# Patient Record
Sex: Female | Born: 1975 | Race: Black or African American | Hispanic: No | Smoking: Never smoker
Health system: Southern US, Community
[De-identification: ages and names within clinical notes are randomized; demographics above are authoritative.]

## PROBLEM LIST (undated history)

## (undated) DIAGNOSIS — I10 Essential (primary) hypertension: Secondary | ICD-10-CM

## (undated) DIAGNOSIS — E559 Vitamin D deficiency, unspecified: Secondary | ICD-10-CM

## (undated) DIAGNOSIS — F419 Anxiety disorder, unspecified: Secondary | ICD-10-CM

## (undated) DIAGNOSIS — N941 Unspecified dyspareunia: Secondary | ICD-10-CM

## (undated) DIAGNOSIS — N946 Dysmenorrhea, unspecified: Secondary | ICD-10-CM

## (undated) DIAGNOSIS — IMO0002 Reserved for concepts with insufficient information to code with codable children: Secondary | ICD-10-CM

## (undated) DIAGNOSIS — E785 Hyperlipidemia, unspecified: Secondary | ICD-10-CM

## (undated) DIAGNOSIS — E7800 Pure hypercholesterolemia, unspecified: Secondary | ICD-10-CM

## (undated) HISTORY — DX: Essential (primary) hypertension: I10

## (undated) HISTORY — PX: OTHER SURGICAL HISTORY: SHX169

## (undated) HISTORY — DX: Vitamin D deficiency, unspecified: E55.9

## (undated) HISTORY — DX: Hyperlipidemia, unspecified: E78.5

## (undated) HISTORY — DX: Anxiety disorder, unspecified: F41.9

---

## 1997-09-21 ENCOUNTER — Emergency Department (HOSPITAL_COMMUNITY): Admission: EM | Admit: 1997-09-21 | Discharge: 1997-09-21 | Payer: Self-pay | Admitting: Emergency Medicine

## 1998-05-14 ENCOUNTER — Inpatient Hospital Stay (HOSPITAL_COMMUNITY): Admission: AD | Admit: 1998-05-14 | Discharge: 1998-05-14 | Payer: Self-pay | Admitting: Obstetrics and Gynecology

## 1998-06-16 ENCOUNTER — Ambulatory Visit (HOSPITAL_COMMUNITY): Admission: RE | Admit: 1998-06-16 | Discharge: 1998-06-16 | Payer: Self-pay | Admitting: Obstetrics and Gynecology

## 1998-08-17 ENCOUNTER — Inpatient Hospital Stay (HOSPITAL_COMMUNITY): Admission: AD | Admit: 1998-08-17 | Discharge: 1998-08-17 | Payer: Self-pay | Admitting: Obstetrics and Gynecology

## 1998-08-30 ENCOUNTER — Inpatient Hospital Stay (HOSPITAL_COMMUNITY): Admission: AD | Admit: 1998-08-30 | Discharge: 1998-09-02 | Payer: Self-pay | Admitting: Obstetrics and Gynecology

## 1998-09-02 ENCOUNTER — Encounter (HOSPITAL_COMMUNITY): Admission: RE | Admit: 1998-09-02 | Discharge: 1998-12-01 | Payer: Self-pay | Admitting: Obstetrics and Gynecology

## 1999-01-08 ENCOUNTER — Emergency Department (HOSPITAL_COMMUNITY): Admission: EM | Admit: 1999-01-08 | Discharge: 1999-01-08 | Payer: Self-pay | Admitting: *Deleted

## 1999-02-06 ENCOUNTER — Emergency Department (HOSPITAL_COMMUNITY): Admission: EM | Admit: 1999-02-06 | Discharge: 1999-02-06 | Payer: Self-pay | Admitting: *Deleted

## 1999-10-30 ENCOUNTER — Encounter: Payer: Self-pay | Admitting: Emergency Medicine

## 1999-10-30 ENCOUNTER — Emergency Department (HOSPITAL_COMMUNITY): Admission: EM | Admit: 1999-10-30 | Discharge: 1999-10-31 | Payer: Self-pay | Admitting: Emergency Medicine

## 1999-12-01 ENCOUNTER — Other Ambulatory Visit: Admission: RE | Admit: 1999-12-01 | Discharge: 1999-12-01 | Payer: Self-pay | Admitting: Obstetrics and Gynecology

## 2000-01-25 ENCOUNTER — Ambulatory Visit (HOSPITAL_COMMUNITY): Admission: RE | Admit: 2000-01-25 | Discharge: 2000-01-25 | Payer: Self-pay | Admitting: Obstetrics and Gynecology

## 2000-01-25 ENCOUNTER — Encounter: Payer: Self-pay | Admitting: Obstetrics and Gynecology

## 2000-02-23 ENCOUNTER — Ambulatory Visit (HOSPITAL_COMMUNITY): Admission: RE | Admit: 2000-02-23 | Discharge: 2000-02-23 | Payer: Self-pay | Admitting: Obstetrics and Gynecology

## 2000-02-23 ENCOUNTER — Encounter: Payer: Self-pay | Admitting: Obstetrics and Gynecology

## 2000-06-10 ENCOUNTER — Inpatient Hospital Stay (HOSPITAL_COMMUNITY): Admission: AD | Admit: 2000-06-10 | Discharge: 2000-06-12 | Payer: Self-pay | Admitting: Obstetrics and Gynecology

## 2001-02-13 ENCOUNTER — Other Ambulatory Visit: Admission: RE | Admit: 2001-02-13 | Discharge: 2001-02-13 | Payer: Self-pay | Admitting: Obstetrics and Gynecology

## 2001-05-28 HISTORY — PX: COLONOSCOPY: SHX174

## 2001-07-28 ENCOUNTER — Ambulatory Visit (HOSPITAL_COMMUNITY): Admission: RE | Admit: 2001-07-28 | Discharge: 2001-07-28 | Payer: Self-pay | Admitting: Obstetrics and Gynecology

## 2001-09-12 ENCOUNTER — Emergency Department (HOSPITAL_COMMUNITY): Admission: EM | Admit: 2001-09-12 | Discharge: 2001-09-12 | Payer: Self-pay | Admitting: Emergency Medicine

## 2002-02-04 ENCOUNTER — Encounter: Payer: Self-pay | Admitting: Obstetrics and Gynecology

## 2002-02-04 ENCOUNTER — Ambulatory Visit (HOSPITAL_COMMUNITY): Admission: RE | Admit: 2002-02-04 | Discharge: 2002-02-04 | Payer: Self-pay | Admitting: Obstetrics and Gynecology

## 2002-03-13 ENCOUNTER — Emergency Department (HOSPITAL_COMMUNITY): Admission: EM | Admit: 2002-03-13 | Discharge: 2002-03-14 | Payer: Self-pay | Admitting: Emergency Medicine

## 2002-12-18 ENCOUNTER — Ambulatory Visit (HOSPITAL_COMMUNITY): Admission: RE | Admit: 2002-12-18 | Discharge: 2002-12-18 | Payer: Self-pay | Admitting: Gastroenterology

## 2003-06-29 HISTORY — PX: UMBILICAL HERNIA REPAIR: SHX196

## 2003-06-29 HISTORY — PX: DIAGNOSTIC LAPAROSCOPY: SUR761

## 2003-07-08 ENCOUNTER — Ambulatory Visit (HOSPITAL_COMMUNITY): Admission: RE | Admit: 2003-07-08 | Discharge: 2003-07-08 | Payer: Self-pay | Admitting: Obstetrics and Gynecology

## 2003-08-13 ENCOUNTER — Inpatient Hospital Stay (HOSPITAL_COMMUNITY): Admission: AD | Admit: 2003-08-13 | Discharge: 2003-08-13 | Payer: Self-pay | Admitting: Obstetrics & Gynecology

## 2003-09-08 ENCOUNTER — Ambulatory Visit (HOSPITAL_COMMUNITY): Admission: RE | Admit: 2003-09-08 | Discharge: 2003-09-08 | Payer: Self-pay | Admitting: Obstetrics and Gynecology

## 2006-03-29 ENCOUNTER — Ambulatory Visit: Payer: Self-pay | Admitting: Gastroenterology

## 2006-09-26 HISTORY — PX: TUBAL LIGATION: SHX77

## 2006-12-23 ENCOUNTER — Inpatient Hospital Stay (HOSPITAL_COMMUNITY): Admission: AD | Admit: 2006-12-23 | Discharge: 2006-12-23 | Payer: Self-pay | Admitting: Obstetrics and Gynecology

## 2006-12-30 ENCOUNTER — Inpatient Hospital Stay (HOSPITAL_COMMUNITY): Admission: AD | Admit: 2006-12-30 | Discharge: 2007-01-01 | Payer: Self-pay | Admitting: Oncology

## 2006-12-31 ENCOUNTER — Encounter (INDEPENDENT_AMBULATORY_CARE_PROVIDER_SITE_OTHER): Payer: Self-pay | Admitting: Obstetrics and Gynecology

## 2007-12-27 HISTORY — PX: LAPAROSCOPIC INCISIONAL / UMBILICAL / VENTRAL HERNIA REPAIR: SUR789

## 2008-01-26 ENCOUNTER — Encounter (INDEPENDENT_AMBULATORY_CARE_PROVIDER_SITE_OTHER): Payer: Self-pay | Admitting: General Surgery

## 2008-01-26 ENCOUNTER — Ambulatory Visit (HOSPITAL_COMMUNITY): Admission: RE | Admit: 2008-01-26 | Discharge: 2008-01-27 | Payer: Self-pay | Admitting: General Surgery

## 2009-01-27 ENCOUNTER — Emergency Department (HOSPITAL_COMMUNITY): Admission: EM | Admit: 2009-01-27 | Discharge: 2009-01-27 | Payer: Self-pay | Admitting: Emergency Medicine

## 2009-06-01 ENCOUNTER — Encounter: Admission: RE | Admit: 2009-06-01 | Discharge: 2009-06-01 | Payer: Self-pay | Admitting: Emergency Medicine

## 2010-10-10 NOTE — Op Note (Signed)
NAME:  Christie [name], Christie [name]              ACCOUNT NO.:  [id number]   MEDICAL RECORD NO.:  [id number]          PATIENT TYPE:  AMB   LOCATION:  DAY                          FACILITY:  [facility]   PHYSICIAN:  Anselm Pancoast. Weatherly, M.D.DATE OF BIRTH:  1975-07-03   DATE OF PROCEDURE:  01/26/2008  DATE OF DISCHARGE:                               OPERATIVE REPORT   PREOPERATIVE DIAGNOSIS:  Recurrent umbilical supraumbilical hernia.   OPERATION:  Repair of recurrent umbilical ventral hernia with Proceed  mesh.  General anesthesia.   SURGEON:  Dr. Consuello Bossier.   ASSISTANT:  Nurse.   HISTORY:  Mr.  Christie [name] is a 35 year old female who originally in  combination with a GYN procedure performed by Dr. Ambrose Mantle the patient had  a small fascial defect at the umbilicus that I closed through the  Pfannenstiel incision for endometriosis when she was 27.  I saw her back  in April 1990 at which time she was having a small bulge above her  umbilicus, and she had had a recent pregnancy, and her abdomen was kind  of stretched.  I recommended that this new hernia be repaired.  It will  obviously need mesh, and she presented back in July wondering if she  could have an abdominoplasty simultaneously with Dr. Ellery [name], she had seen  him.  I recommended that:  I would concentrate on the umbilical hernia;  whether or not the abdominoplasty and etc., could be approved could be  left to she and Dr. Ellery [name].  She returns and says she just wants to  proceed on with the umbilical hernia repair, and she is here for the  planned procedure.  The bulge is above the umbilicus slightly to the  right of midline, and I will plan on making a small incision on the  area.  I am sure that we will have to place mesh and whether or not it  will be basically in the abdominal wall or intraperitoneally will be  determined.  The patient preoperatively was given a gram of Ancef,  positioned on the OR table.  Induction of general anesthesia  endotracheal tube.  We had got complete relaxation, and I made a small  vertical incision down above the umbilicus.  Her original small incision  that I had was kind of a of subumbilical transverse incision.  She has  had an umbilical ring in that had been previously removed, and there is  no evidence of any inflammation of the abdominal wall that I could see  at this time.  Sharp dissection down through the skin and subcutaneous  tissue.  The little bulge was identified and, I opened very carefully  into the peritoneal cavity.  There was a lot of omentum up in the area.  This kind of goes to the right of midline and really with the old defect  and some hard nodules kind of in the umbilical intraperitoneally.  It  was necessary to completely free all this up circumferentially.  Good  hemostasis was determined by actually ligating into the little areas of  the omentum that was caught there, and  then I actually just removed all  of the old scar at the umbilicus where the previous suture repair had  been done.  The hernia sac was removed.  The omentum that was within it  was viable, placed back in the abdominal cavity, and her abdominal wall  was so thin that it is not going to possible to use a piece of mesh  extraperitoneally intra-abdominally, and I elected to use a piece of  Proceed mesh and actually trimmed it, so it was about 4 x 5 inches and  comes 4 x 6 inches.  I placed it in the intra-abdominal cavity.  The  omentum was under it.  The bowel was well covered by the omentum, and  then I used sutures of 0 Prolene placed through the abdominal wall  through this skin incision, anchored the mesh out in all 4 sides and  then 2 sutures between each of those, so that the mesh is anchored up in  the abdominal wall without tension.  I then closed the fascia over it  with interrupted sutures of 0 Prolene, kind of picking up a little bit  of the mesh in the area for a kind of a composite repair.   The  subcutaneous tissue was then closed with 2-0 Vicryl.  I did put Marcaine  with adrenalin in the area, and then the skin  was closed with some 4-0 Vicryl sutures and then 5-0 nylon on the skin.  I used some Marcaine with abdominal binder on her, and I expect that she  will be sore enough that she will need to spend the night.  I think she  can be released tomorrow.  Hopefully, she will be able to tolerate a  liquid diet today and not need a Foley catheter.           ______________________________  Anselm Pancoast. Zachery Dakins, M.D.     WJW/MEDQ  D:  01/26/2008  T:  01/26/2008  Job:  [id number]   cc:   Christie [name], M.D.  Fax: [phone]   Anselm Pancoast. Zachery Dakins, M.D.  [address]  [place]  [place] [address]

## 2010-10-10 NOTE — Discharge Summary (Signed)
NAME:  Christie [name], Christie [name]              ACCOUNT NO.:  [id number]   MEDICAL RECORD NO.:  [id number]          PATIENT TYPE:  INP   LOCATION:  9116                          FACILITY:  [facility]   PHYSICIAN:  Sherron Monday, MD        DATE OF BIRTH:  24-Nov-1975   DATE OF ADMISSION:  12/30/2006  DATE OF DISCHARGE:  01/01/2007                               DISCHARGE SUMMARY   ADMITTING DIAGNOSIS:  Intrauterine pregnancy at term, undesired  fertility.   DISCHARGE DIAGNOSES:  Intrauterine pregnancy at term, undesired  fertility, status post spontaneous vaginal delivery and postpartum  bilateral tubal ligation.   HISTORY OF PRESENT ILLNESS:  Patient is a 35 year old G7, P2-1-3-3, at  37+4 by eleven-week ultrasound with an EDC of January 14, 2007, who  presents with regular contractions.  Evaluation in our office had a  vaginal exam of 4, 50 and -2.   PRENATAL CARE:  She had headaches that were evaluated by neurology, a  rash at approximately 23 weeks, otherwise was uncomplicated.   PAST MEDICAL HISTORY:  Not significant.   PAST SURGICAL HISTORY:  Significant for a laparoscopic umbilical hernia  repair.   PAST OB/GYN HISTORY:  She has a history of two term deliveries, 6 pounds  9 ounces; 5 pounds 10 ounces; a 36-week delivery, 4 pounds 7 ounces; two  elective abortions and one spontaneous abortion, as well as a history of  an abnormal Pap smear.   MEDICATIONS:  None.   ALLERGIES:  No known drug allergies.   SOCIAL HISTORY:  She denies alcohol, tobacco or drug use.   FAMILY HISTORY:  Noncontributory.   PRENATAL LABS:  AB-positive, antibody screen negative, RPR nonreactive,  rubella immune, hepatitis B surface antigen negative, HIV negative,  Gonorrhea negative, Chlamydia negative.  First-trimester screen within  normal limits.  Group B strep negative.   On admission, she was afebrile with stable vital signs.  Fetal heart  tones were reactive with irregular contractions.  She was, at this  time,  5, 50 and -2.  Membranes were ruptured for clear fluids.  Benign exam  otherwise.  She was monitored in her labor.  She progressed rapidly and  precipitously delivered a female infant with a weight of 2740 g and Apgars  of 8 at one minute, 9 at five minutes.   Her postpartum course was relatively uncomplicated.  She remained  afebrile with stable vital signs throughout.  Her hemoglobin decreased  from 9.8 to 9.4  She had on postpartum day #1, a tubal ligation  performed after discussion of undesired fertility with the patient and  permanence of the tubal ligation, as well as the risks, benefits, and  alternatives, which were discussed with her, as noted in her chart.  She  underwent that without complication.  She was discharged to home on  postpartum day #2 with routine discharge instructions and she had no  complaints, except forsoreness at her incision.  This was well-  controlled with medications.   She was discharged home with Motrin, Percocet and prenatal vitamins.  She will follow up in approximately two weeks for  a postpartum check of  this.   DISCHARGE INFORMATION:  She will breast-feed.  AB-positive.  Rubella  immune.  Bilateral tubal ligation.  Hemoglobin decreased from 9.8 to  9.4.      Sherron Monday, MD  Electronically Signed     JB/MEDQ  D:  01/01/2007  T:  01/01/2007  Job:  [id number]

## 2010-10-10 NOTE — Op Note (Signed)
NAME:  Christie [name], Christie [name]              ACCOUNT NO.:  [id number]   MEDICAL RECORD NO.:  [id number]          PATIENT TYPE:  INP   LOCATION:  9116                          FACILITY:  [facility]   PHYSICIAN:  Sherron Monday, MD        DATE OF BIRTH:  [birth year]   DATE OF PROCEDURE:  12/31/2006  DATE OF DISCHARGE:                               OPERATIVE REPORT   PREOPERATIVE DIAGNOSIS:  Undesired fertility, postpartum, status post  spontaneous vaginal delivery.   POSTOPERATIVE DIAGNOSIS:  Undesired fertility, postpartum, status post  spontaneous vaginal delivery.   PROCEDURE:  Postpartum bilateral tubal ligation by Pomeroy method.   SURGEON:  Sherron Monday, MD   ANESTHESIA:  General endotracheal and local.   IV FLUIDS:  700 mL.   URINE OUTPUT:  In and out cath at start of procedure.   ESTIMATED BLOOD LOSS:  Minimal.   COMPLICATIONS:  None.   PATHOLOGY:  Bilateral tubal segments   FINDINGS:  Normal postpartum uterus, normal tubes and ovaries  bilaterally.   DISPOSITION:  Stable to PACU.   PROCEDURE:  After informed consent was reviewed with the patient  including risks, benefits and alternatives of surgical procedure  including but not limited to bleeding, infection, damage to surrounding  organs as well as a rate of failure of approximately one out 200 with  increased risk of ectopic pregnancy. She was transported to the  operating room where a approximately 2 cm infraumbilical incision was  made in the level of her previous hernia repair incision carried through  the underlying layer of fascia.  As we were extending the incision down,  it was noted that she had mesh superiorly so the decision was made to  proceed inferior to the mesh.  The fascia was grasped with Kocher  clamps, elevated and incised with Mayo scissors.  The incision was  extended laterally with Mayo scissors.  The peritoneum was entered  bluntly. The uterus was immediately palpated.  The left tube was easily  visualixed  after bowels were packed away with a moist tape laparotomy  sponge.  The tube was visualized followed out to the fimbriated end and  ligated in Pomeroy fashion, doubly ligated with 0-0 plain gut.  The  intervening portion was excise and sent to pathology  Hemostasis was  assured after the tube was excised and this tube was replaced to the  abdomen and attention was turned to the right tube which in a similar  fashion, the bowel was packed away with moist tape laparotomy sponge and  the tube was swept and easily visualized followed out to the fimbriated  end and in a Pomeroy fashion was doubly ligated with 0-0 plain gut.  The  intervening portion was excised and sent to pathology.  Again when the  tube was excised it was found to be hemostatic.  The tube was returned  to the abdomen,  the sponge was removed.  The fascia was reapproximated 0-0 Vicryl in a  running fashion. The skin was closed in a subcuticular fashion with 3-0  Vicryl.  The patient tolerated the procedure well.  Sponge, lap and  needle counts were correct x2 at the end of procedure.      Sherron Monday, MD  Electronically Signed     JB/MEDQ  D:  12/31/2006  T:  01/01/2007  Job:  [id number]

## 2010-10-13 NOTE — Assessment & Plan Note (Signed)
[facility]                           GASTROENTEROLOGY OFFICE NOTE   NAME:[name], Christie MCCLATCHY                      MRN:          [id number]  DATE:03/29/2006                            DOB:          Oct 14, 1975    REFERRING PHYSICIAN:  Malachi Pro. Ambrose Mantle, M.D.   REASON FOR CONSULTATION:  Nausea and abdominal pain.   HISTORY OF PRESENT ILLNESS:  Christie [name] is a 35 year old African-American  female referred through the courtesy of Dr. Ambrose Mantle for evaluation. She has  had intermittent nausea. This has been a problem for years. This will occur  immediately postprandially. It is often associated with lower abdominal  pain. She complaints of sharp periumbilical pain. She underwent repair of an  umbilical hernia several years ago. The pain is unaffected by bowel  movements. She moves her bowels regularly. She develops bloating and gas and  sometimes diarrhea with dairy products. She is on no medication including  nonsteroidals. The pain is not related to her periods.   PAST MEDICAL HISTORY:  Pertinent for herniorrhaphy.   FAMILY HISTORY:  Noncontributory.   She is on no medications, she has no allergies. She neither smokes nor  drinks. She is separated and works for [employer].   REVIEW OF SYSTEMS:  Positive for headaches.   PHYSICAL EXAMINATION:  VITAL SIGNS:  Pulse 70, blood pressure 120/64, weight  141.  HEENT:  EOMI. PERRLA. Sclerae are anicteric.  Conjunctivae are pink.  NECK:  Supple without thyromegaly, adenopathy or carotid bruits.  CHEST:  Clear to auscultation and percussion without adventitious sounds.  CARDIAC:  Regular rhythm; normal S1 S2.  There are no murmurs, gallops or  rubs.  ABDOMEN:  She has a small periumbilical hernia that is minimally tender.  There are no abdominal masses or organomegaly.  EXTREMITIES:  Full range of motion.  No cyanosis, clubbing or edema.  RECTAL:  Deferred.   IMPRESSION:  1. Postprandial nausea. This is probably due  to nonulcer dyspepsia. Active      peptic disease is less likely.  2. Abdominal pain. This may be related to #1. In addition, she appears to      have a small ventral hernia that could be symptomatic.  3. Lactose intolerant.   RECOMMENDATION:  1. Trial of ranitidine 150 mg twice a day.  2. Consider reevaluation with surgery.  3. To consider upper endoscopy if symptoms are not improved with empiric      therapy with ranitidine.  4. Lactate supplementation.     Christie [name]. Christie Dice, MD,FACG  Electronically Signed    RDK/MedQ  DD: 03/29/2006  DT: 03/29/2006  Job #: [id number]   cc:   Malachi Pro. Ambrose Mantle, M.D.  Anselm Pancoast. Zachery Dakins, M.D.

## 2010-10-13 NOTE — Op Note (Signed)
NAME:  Christie [name], Christie [name]                         ACCOUNT NO.:  [id number]   MEDICAL RECORD NO.:  [id number]                   PATIENT TYPE:  AMB   LOCATION:  DAY                                  FACILITY:  [facility]   PHYSICIAN:  Malachi Pro. Ambrose Mantle, M.D.              DATE OF BIRTH:  02/16/1976   DATE OF PROCEDURE:  07/08/2003  DATE OF DISCHARGE:                                 OPERATIVE REPORT   PREOPERATIVE DIAGNOSES:  1. Chronic left lower abdominal pain.  2. Dyspareunia.  3. Menorrhagia.  4. Umbilical hernia.   POSTOPERATIVE DIAGNOSES:  1. Chronic left lower abdominal pain.  2. Dyspareunia.  3. Menorrhagia.  4. Umbilical hernia.   OPERATION:  1. Diagnostic laparoscopy.  2. Repair of umbilical hernia.   OPERATORS:  Malachi Pro. Ambrose Mantle, M.D., and Anselm Pancoast. Zachery Dakins, M.D.   General anesthesia.   The patient was brought to the operating room and placed under satisfactory  general anesthesia.  She was placed in the [place] stirrups.  The abdomen,  vulva, vagina, and urethra were prepped with Betadine solution.  The bladder  was emptied with a [place] catheter.  The cervix was drawn into the operative  field and after examination, a Hulka cannula was placed into the uterus and  attached to the anterior cervical lip.  On examination the uterus was  anterior and normal size.  There was a question of a 2 cm mass just lateral  to the left uterosacral ligament.  I could not be sure if this was an  enlarged ovary.  I did not feel any abnormalities of the cul-de-sac.  The  abdomen was then draped as a sterile field.  The umbilical hernia was  identified, and Dr. Zachery Dakins was there.  I made an incision in the inferior  aspect of the umbilicus and he entered the umbilical hernia sac.  I placed a  pursestring suture of 0 Vicryl around the fascia, placed a Hasson cannula  into the abdominal cavity, and inserted a smaller trocar suprapubically.  Exploration of the pelvis revealed the following  findings:  The posterior  cul-de-sac had several dots, one was a white bleb, two were dark brown, and  one was red, that probably represented endometriosis.  The uterus itself was  anterior and mottled in appearance, but no fibroids were seen.  It was felt  to be consistent with adenomyosis.  The anterior cul-de-sac was normal.  The  right tube was normal to its fimbriae.  The right ovary was normal.  The  left tube was normal to its fimbriae, and the left ovary was normal.  Just  lateral to the left uterosacral ligament was a band of white tissue that was  presumably scar tissue, possibly from endometriosis.  More lateral and just  on the pelvic wall and just inferior to the left utero-ovarian ligament was  an area definitely of scarring that was thought to be  probably secondary to  endometriosis.  I did not find anything that I thought represented the 2 cm  mass that I had felt on pelvic exam, so I think with this patient's chronic  complaints that if she has not already had a CT scan, we will do a CT scan  in the postoperative period.  At the present time it appears that she  probably has adenomyosis and some pelvic endometriosis with scarring,  although there is absolutely no adherence of the ovaries to the posterior  broad ligament.  After I completed my diagnostic procedure, I looked up at  the liver, it was smooth.  I did not see the gallbladder.  I did not see any  upper abdominal abnormalities.  Dr. Zachery Dakins and I both looked at the  sigmoid, and no abnormalities were found.  He then proceeded to dissect out  the umbilical hernia and he will dictate that separately, and he closed the  skin.  The patient was returned to recovery in satisfactory condition.                                               Malachi Pro. Ambrose Mantle, M.D.    TFH/MEDQ  D:  07/08/2003  T:  07/08/2003  Job:  [id number]   cc:   Anselm Pancoast. Zachery Dakins, M.D.  [address], [address]  [place]  [place] [address]  Fax:  [phone]

## 2010-10-13 NOTE — Op Note (Signed)
NAME:  [name], [name]                         ACCOUNT NO.:  [id number]   MEDICAL RECORD NO.:  [id number]                   PATIENT TYPE:  AMB   LOCATION:  DAY                                  FACILITY:  [facility]   PHYSICIAN:  Anselm Pancoast. Zachery Dakins, M.D.          DATE OF BIRTH:  06/30/1975   DATE OF PROCEDURE:  07/08/2003  DATE OF DISCHARGE:                                 OPERATIVE REPORT   PREOPERATIVE DIAGNOSES:  1. Endometriosis.  2. Umbilical hernia.   POSTOPERATIVE DIAGNOSES:  1. Endometriosis.  2. Umbilical hernia.   OPERATION:  Repair of umbilical hernia.  Dr. Ambrose Mantle did a laparoscopic exam  for the endometriosis and he dictated that.   HISTORY:  Christie [name] is a 35 year old female who has had chronic left  sided pelvic pain.  She has seen Dr. Ambrose Mantle after having a GI work up and he  is planning to do a laparoscopic examination and possible other procedures.  He noted that she had an umbilical hernia and asked me to see her and I  agreed to scrub in and met the patient preoperatively.  Dr. Ambrose Mantle opened  the incision and went through the umbilical hernia sac.  A purse-string  suture was placed and then he did the laparoscopic exam, confirming that she  did have some pelvic endometriosis.  After he completed his procedure and a  5 mm trocar had been placed, I then removed the little preperitoneal space  and hernia sac, and then actually did a transverse closure with 0 surgical  and five sutures all total being used.  It was pants-over-vest and did not  actually close the peritoneum, but made sure there was no bleed and then  where the little fatty tissue had been removed, those pedicles had been  ligated with 3-0 Vicryl.  The subcutaneous tissue was closed with 3-0 Vicryl  sutures.  They inverted nicely and also put a simple stitch subcuticularly  in the 5 mm trocar site.  The skin was closed transversely with Steri-  Strips.  I did inject Marcaine for immediate  postoperative pain.  She will  be released after a short stay in the recovery room.  I will see her back in  the office in approximately two weeks for a final postop visit.                                               Anselm Pancoast. Zachery Dakins, M.D.    WJW/MEDQ  D:  07/08/2003  T:  07/08/2003  Job:  [id number]   cc:   Malachi Pro. Ambrose Mantle, M.D.  [address]  [address]  [place]  [place] [address]  Fax: [phone]

## 2010-10-13 NOTE — Discharge Summary (Signed)
[facility]  Patient:    Christie [name], Christie [name]                      MRN: [id number] Adm. Date:  [id number] Disc. Date: 06/12/00 Attending:  Malon Kindle                           Discharge Summary  HISTORY OF PRESENT ILLNESS:   This is a 35 year old black married female, para 1-1-0-2, gravida 3, last period unknown, [facility] January 29 and June 24, 2000, by first- and second-trimester ultrasounds respectively, admitted in early later.  Blood group and type AB positive, negative antibody, sickle cell negative.  RPR nonreactive.  Rubella immune.  Hepatitis B surface antigen negative.  HIV negative.  GC and chlamydia negative.  One-hour Glucola 112. Group B strep positive with her last pregnancy.  Triple screen normal. Vaginal ultrasound on November 16, 1999:  Crown-rump length 1.7 cm, 8 weeks 2 days, [facility] June 25, 2000.  Repeat ultrasound on January 24, 2001:  Average gestational age [redacted] weeks 3 days, [facility] June 24, 2000.  The patient had uncomplicated prenatal course.  She began contracting the night prior to admission.  She was seen in my office on the day of admission with cervix 4 cm, 70%, vertex at a -2.  She came here and progressed to 4-5 cm.  ALLERGIES:                    No known allergies.  PAST MEDICAL HISTORY:         Sinus surgery at age 71.  Usual childhood diseases.  ALCOHOL, TOBACCO, DRUGS:      None.  FAMILY HISTORY:               No significant history in first-degree relatives.  OBSTETRICAL HISTORY:          In November 1995, she delivered a 6-pound 9-ounce female at 40 weeks.  In April 2000, a 4-pound 7-ounce female at 36 weeks.  PHYSICAL EXAMINATION:  VITAL SIGNS:                  Normal.  ABDOMEN:                      Soft.  Fundal height 37 cm.  Fetal heart tones normal.  PELVIC:                       Cervix 4-5 cm, 70%, vertex, -2.  Artificial rupture of membranes produced clear fluid.  ADMISSION IMPRESSION:          Intrauterine pregnancy at 38 weeks, early labor. History of positive group B strep.  HOSPITAL COURSE:              The patient was placed on IV penicillin.  Her contractions remained irregular, so Pitocin was begun at 9:22 p.m.  At 9:27 p.m. the cervix was 7+ cm, 80-90%, vertex at a -1.  She progressed very rapidly and delivered spontaneously over an intact perineum by Dr. Ambrose Mantle before we could place her in stirrups or prep the perineum.  The infant was female, 5 pounds 10 ounces, Apgars of 9 at one and 9 at five minutes, placenta intact.  Uterus normal, no lacerations were encountered.  Blood loss about 200 cc.  Postpartum the patient did very well and was discharged on the  second postpartum day.  RPR was nonreactive.  Hemoglobin on admission 11.3, hematocrit 32.5, white count 7300, platelet count 201,000.  Follow-up hemoglobin 10.8, hematocrit 31.6.  FINAL DIAGNOSIS:              Intrauterine pregnancy at 38 weeks, delivered occiput anterior.  OPERATIONS:                   Spontaneous delivery OA.  CONDITION ON DISCHARGE:       Improved.  DISCHARGE INSTRUCTIONS:       Include our regular discharge instruction booklet.  MEDICATIONS:                  The patient declines analgesics at discharge.  FOLLOW-UP:                    She is advised to return in six weeks for follow-up examination. DD:  06/12/00 TD:  06/12/00 Job: [id number]

## 2011-03-12 LAB — CBC
HCT: 30.4 — ABNORMAL LOW
MCHC: 32.2
MCV: 72.8 — ABNORMAL LOW
RBC: 3.96
RDW: 19 — ABNORMAL HIGH
WBC: 9.8

## 2011-09-14 ENCOUNTER — Encounter (HOSPITAL_COMMUNITY): Payer: Self-pay

## 2011-09-25 ENCOUNTER — Encounter (HOSPITAL_COMMUNITY)
Admission: RE | Admit: 2011-09-25 | Discharge: 2011-09-25 | Disposition: A | Discharge status: Home / Self Care | Payer: BC Managed Care – PPO | Source: Ambulatory Visit | Attending: Obstetrics and Gynecology | Admitting: Obstetrics and Gynecology

## 2011-09-25 ENCOUNTER — Encounter (HOSPITAL_COMMUNITY): Payer: Self-pay | Admitting: *Deleted

## 2011-09-25 ENCOUNTER — Encounter (HOSPITAL_COMMUNITY): Payer: Self-pay

## 2011-09-25 HISTORY — DX: Pure hypercholesterolemia, unspecified: E78.00

## 2011-09-25 HISTORY — DX: Reserved for concepts with insufficient information to code with codable children: IMO0002

## 2011-09-25 LAB — CBC
Hemoglobin: 11.5 g/dL — ABNORMAL LOW (ref 12.0–15.0)
MCH: 25.4 pg — ABNORMAL LOW (ref 26.0–34.0)
Platelets: 278 10*3/uL (ref 150–400)
WBC: 6.5 10*3/uL (ref 4.0–10.5)

## 2011-09-25 LAB — SURGICAL PCR SCREEN: MRSA, PCR: NEGATIVE

## 2011-09-25 NOTE — Patient Instructions (Addendum)
   Your procedure is scheduled on:  Tuesday, May 7th  Enter through the Main Entrance of [facility] at: 7:30am Pick up the phone at the desk and dial [phone] and inform us of your arrival.  Please call this number if you have any problems the morning of surgery: [phone]  Remember: Do not eat food after midnight: Monday Do not drink clear liquids after: Monday Take these medicines the morning of surgery with a SIP OF WATER: None  Do not wear jewelry, make-up, or FINGER nail polish Do not wear lotions, powders, perfumes or deodorant. Do not shave 48 hours prior to surgery. Do not bring valuables to the hospital. Contacts, dentures or bridgework may not be worn into surgery.  Leave suitcase in the car. After Surgery it may be brought to your room. For patients being admitted to the hospital, checkout time is 11:00am the day of discharge.  Home with fiance Christie [name] Asa cell [phone].  Patients discharged on the day of surgery will not be allowed to drive home.     Remember to use your hibiclens as instructed.Please shower with 1/2 bottle the evening before your surgery and the other 1/2 bottle the morning of surgery. Neck down avoiding private area.

## 2011-10-01 MED ORDER — CEFAZOLIN SODIUM-DEXTROSE 2-3 GM-% IV SOLR
2.0000 g | INTRAVENOUS | Status: AC
  Administered 2011-10-02: 2 g via INTRAVENOUS
  Filled 2011-10-01: qty 50

## 2011-10-02 ENCOUNTER — Encounter (HOSPITAL_COMMUNITY)
Admission: RE | Disposition: A | Discharge status: Home / Self Care | Payer: Self-pay | Source: Ambulatory Visit | Attending: Obstetrics and Gynecology

## 2011-10-02 ENCOUNTER — Encounter (HOSPITAL_COMMUNITY): Payer: Self-pay | Admitting: Anesthesiology

## 2011-10-02 ENCOUNTER — Ambulatory Visit (HOSPITAL_COMMUNITY): Payer: BC Managed Care – PPO | Admitting: Anesthesiology

## 2011-10-02 ENCOUNTER — Ambulatory Visit (HOSPITAL_COMMUNITY)
Admission: RE | Admit: 2011-10-02 | Discharge: 2011-10-03 | Disposition: A | Discharge status: Home / Self Care | Payer: BC Managed Care – PPO | Source: Ambulatory Visit | Attending: Obstetrics and Gynecology | Admitting: Obstetrics and Gynecology

## 2011-10-02 ENCOUNTER — Encounter (HOSPITAL_COMMUNITY): Payer: Self-pay | Admitting: Obstetrics and Gynecology

## 2011-10-02 ENCOUNTER — Encounter (HOSPITAL_COMMUNITY): Payer: Self-pay | Admitting: *Deleted

## 2011-10-02 DIAGNOSIS — IMO0002 Reserved for concepts with insufficient information to code with codable children: Secondary | ICD-10-CM | POA: Insufficient documentation

## 2011-10-02 DIAGNOSIS — N941 Unspecified dyspareunia: Secondary | ICD-10-CM

## 2011-10-02 DIAGNOSIS — N946 Dysmenorrhea, unspecified: Secondary | ICD-10-CM

## 2011-10-02 DIAGNOSIS — Z90710 Acquired absence of both cervix and uterus: Secondary | ICD-10-CM

## 2011-10-02 HISTORY — DX: Dysmenorrhea, unspecified: N94.6

## 2011-10-02 HISTORY — DX: Unspecified dyspareunia: N94.10

## 2011-10-02 HISTORY — PX: VAGINAL HYSTERECTOMY: SHX2639

## 2011-10-02 HISTORY — PX: LAPAROSCOPY: SHX197

## 2011-10-02 LAB — BASIC METABOLIC PANEL
BUN: 10 mg/dL (ref 6–23)
Chloride: 104 mEq/L (ref 96–112)
Creatinine, Ser: 0.94 mg/dL (ref 0.50–1.10)
Glucose, Bld: 90 mg/dL (ref 70–99)
Potassium: 3.9 mEq/L (ref 3.5–5.1)

## 2011-10-02 LAB — PREGNANCY, URINE: Preg Test, Ur: NEGATIVE

## 2011-10-02 SURGERY — HYSTERECTOMY, VAGINAL
Anesthesia: General | Site: Vagina | Wound class: Clean Contaminated

## 2011-10-02 MED ORDER — SODIUM CHLORIDE 0.9 % IJ SOLN: 9.0000 mL | INTRAMUSCULAR | Status: DC | PRN

## 2011-10-02 MED ORDER — DEXAMETHASONE SODIUM PHOSPHATE 10 MG/ML IJ SOLN
INTRAMUSCULAR | Status: DC | PRN
  Administered 2011-10-02: 10 mg via INTRAVENOUS

## 2011-10-02 MED ORDER — ONDANSETRON HCL 4 MG/2ML IJ SOLN
INTRAMUSCULAR | Status: DC | PRN
  Administered 2011-10-02: 4 mg via INTRAVENOUS

## 2011-10-02 MED ORDER — GLYCOPYRROLATE 0.2 MG/ML IJ SOLN
INTRAMUSCULAR | Status: AC
  Filled 2011-10-02: qty 1

## 2011-10-02 MED ORDER — NALOXONE HCL 0.4 MG/ML IJ SOLN: 0.4000 mg | INTRAMUSCULAR | Status: DC | PRN

## 2011-10-02 MED ORDER — GLYCOPYRROLATE 0.2 MG/ML IJ SOLN
INTRAMUSCULAR | Status: DC | PRN
  Administered 2011-10-02: 0.3 mg via INTRAVENOUS
  Administered 2011-10-02: 0.4 mg via INTRAVENOUS
  Administered 2011-10-02: 0.1 mg via INTRAVENOUS

## 2011-10-02 MED ORDER — BUPIVACAINE HCL (PF) 0.25 % IJ SOLN
INTRAMUSCULAR | Status: AC
  Filled 2011-10-02: qty 30

## 2011-10-02 MED ORDER — KETOROLAC TROMETHAMINE 30 MG/ML IJ SOLN
INTRAMUSCULAR | Status: DC | PRN
  Administered 2011-10-02: 30 mg via INTRAVENOUS

## 2011-10-02 MED ORDER — MENTHOL 3 MG MT LOZG
1.0000 | LOZENGE | OROMUCOSAL | Status: DC | PRN
  Filled 2011-10-02: qty 9

## 2011-10-02 MED ORDER — FENTANYL CITRATE 0.05 MG/ML IJ SOLN
INTRAMUSCULAR | Status: AC
  Filled 2011-10-02: qty 5

## 2011-10-02 MED ORDER — ZOLPIDEM TARTRATE 5 MG PO TABS: 5.0000 mg | ORAL_TABLET | Freq: Every evening | ORAL | Status: DC | PRN

## 2011-10-02 MED ORDER — DEXAMETHASONE SODIUM PHOSPHATE 10 MG/ML IJ SOLN
INTRAMUSCULAR | Status: AC
  Filled 2011-10-02: qty 1

## 2011-10-02 MED ORDER — GUAIFENESIN 100 MG/5ML PO SOLN
15.0000 mL | ORAL | Status: DC | PRN
  Filled 2011-10-02: qty 15

## 2011-10-02 MED ORDER — BUPIVACAINE HCL (PF) 0.25 % IJ SOLN
INTRAMUSCULAR | Status: DC | PRN
  Administered 2011-10-02: 4 mL

## 2011-10-02 MED ORDER — DIPHENHYDRAMINE HCL 50 MG/ML IJ SOLN: 12.5000 mg | Freq: Four times a day (QID) | INTRAMUSCULAR | Status: DC | PRN

## 2011-10-02 MED ORDER — PROPOFOL 10 MG/ML IV EMUL
INTRAVENOUS | Status: AC
  Filled 2011-10-02: qty 20

## 2011-10-02 MED ORDER — NEOSTIGMINE METHYLSULFATE 1 MG/ML IJ SOLN
INTRAMUSCULAR | Status: DC | PRN
  Administered 2011-10-02 (×2): 2 mg via INTRAVENOUS

## 2011-10-02 MED ORDER — DIPHENHYDRAMINE HCL 12.5 MG/5ML PO ELIX
12.5000 mg | ORAL_SOLUTION | Freq: Four times a day (QID) | ORAL | Status: DC | PRN
  Filled 2011-10-02: qty 5

## 2011-10-02 MED ORDER — LACTATED RINGERS IV SOLN
INTRAVENOUS | Status: DC
  Administered 2011-10-02 – 2011-10-03 (×3): via INTRAVENOUS

## 2011-10-02 MED ORDER — LIDOCAINE HCL (CARDIAC) 20 MG/ML IV SOLN
INTRAVENOUS | Status: DC | PRN
  Administered 2011-10-02: 50 mg via INTRAVENOUS

## 2011-10-02 MED ORDER — FENOFIBRATE 160 MG PO TABS
160.0000 mg | ORAL_TABLET | Freq: Every day | ORAL | Status: DC
  Administered 2011-10-03: 160 mg via ORAL
  Filled 2011-10-02 (×3): qty 1

## 2011-10-02 MED ORDER — VASOPRESSIN 20 UNIT/ML IJ SOLN
INTRAMUSCULAR | Status: AC
  Filled 2011-10-02: qty 1

## 2011-10-02 MED ORDER — LACTATED RINGERS IV SOLN
INTRAVENOUS | Status: DC
  Administered 2011-10-02 (×3): via INTRAVENOUS

## 2011-10-02 MED ORDER — PROPOFOL 10 MG/ML IV EMUL
INTRAVENOUS | Status: DC | PRN
  Administered 2011-10-02: 180 mg via INTRAVENOUS

## 2011-10-02 MED ORDER — NEOSTIGMINE METHYLSULFATE 1 MG/ML IJ SOLN
INTRAMUSCULAR | Status: AC
Start: 2011-10-02 — End: 2011-10-02
  Filled 2011-10-02: qty 10

## 2011-10-02 MED ORDER — PHENYLEPHRINE 40 MCG/ML (10ML) SYRINGE FOR IV PUSH (FOR BLOOD PRESSURE SUPPORT)
PREFILLED_SYRINGE | INTRAVENOUS | Status: AC
Start: 2011-10-02 — End: 2011-10-02
  Filled 2011-10-02: qty 5

## 2011-10-02 MED ORDER — KETOROLAC TROMETHAMINE 30 MG/ML IJ SOLN
INTRAMUSCULAR | Status: AC
  Filled 2011-10-02: qty 1

## 2011-10-02 MED ORDER — MORPHINE SULFATE (PF) 1 MG/ML IV SOLN
INTRAVENOUS | Status: DC
  Administered 2011-10-02: 24 mg via INTRAVENOUS
  Administered 2011-10-02: 17 mL via INTRAVENOUS
  Administered 2011-10-02 (×2): via INTRAVENOUS
  Administered 2011-10-03 (×2): 5 mg via INTRAVENOUS
  Filled 2011-10-02 (×2): qty 25

## 2011-10-02 MED ORDER — ONDANSETRON HCL 4 MG PO TABS: 4.0000 mg | ORAL_TABLET | Freq: Four times a day (QID) | ORAL | Status: DC | PRN

## 2011-10-02 MED ORDER — IBUPROFEN 800 MG PO TABS: 800.0000 mg | ORAL_TABLET | Freq: Three times a day (TID) | ORAL | Status: DC | PRN

## 2011-10-02 MED ORDER — ALUM & MAG HYDROXIDE-SIMETH 200-200-20 MG/5ML PO SUSP
30.0000 mL | ORAL | Status: DC | PRN
  Filled 2011-10-02: qty 30

## 2011-10-02 MED ORDER — ONDANSETRON HCL 4 MG/2ML IJ SOLN
4.0000 mg | Freq: Four times a day (QID) | INTRAMUSCULAR | Status: DC | PRN
  Administered 2011-10-02 (×2): 4 mg via INTRAVENOUS
  Filled 2011-10-02 (×2): qty 2

## 2011-10-02 MED ORDER — PHENYLEPHRINE HCL 10 MG/ML IJ SOLN
INTRAMUSCULAR | Status: DC | PRN
  Administered 2011-10-02: 80 ug via INTRAVENOUS
  Administered 2011-10-02 (×2): 40 ug via INTRAVENOUS

## 2011-10-02 MED ORDER — HYDROMORPHONE HCL PF 1 MG/ML IJ SOLN
0.2500 mg | INTRAMUSCULAR | Status: DC | PRN
  Administered 2011-10-02 (×3): 0.25 mg via INTRAVENOUS

## 2011-10-02 MED ORDER — SIMETHICONE 80 MG PO CHEW: 80.0000 mg | CHEWABLE_TABLET | Freq: Four times a day (QID) | ORAL | Status: DC | PRN

## 2011-10-02 MED ORDER — OXYCODONE-ACETAMINOPHEN 5-325 MG PO TABS
1.0000 | ORAL_TABLET | ORAL | Status: DC | PRN
  Administered 2011-10-03: 2 via ORAL
  Filled 2011-10-02: qty 2

## 2011-10-02 MED ORDER — ONDANSETRON HCL 4 MG/2ML IJ SOLN
INTRAMUSCULAR | Status: AC
  Filled 2011-10-02: qty 2

## 2011-10-02 MED ORDER — MIDAZOLAM HCL 5 MG/5ML IJ SOLN
INTRAMUSCULAR | Status: DC | PRN
  Administered 2011-10-02: 2 mg via INTRAVENOUS

## 2011-10-02 MED ORDER — ROCURONIUM BROMIDE 100 MG/10ML IV SOLN
INTRAVENOUS | Status: DC | PRN
  Administered 2011-10-02: 10 mg via INTRAVENOUS
  Administered 2011-10-02: 20 mg via INTRAVENOUS
  Administered 2011-10-02 (×2): 10 mg via INTRAVENOUS

## 2011-10-02 MED ORDER — MIDAZOLAM HCL 2 MG/2ML IJ SOLN
INTRAMUSCULAR | Status: AC
  Filled 2011-10-02: qty 2

## 2011-10-02 MED ORDER — LIDOCAINE HCL (CARDIAC) 20 MG/ML IV SOLN
INTRAVENOUS | Status: AC
  Filled 2011-10-02: qty 5

## 2011-10-02 MED ORDER — INDIGOTINDISULFONATE SODIUM 8 MG/ML IJ SOLN
INTRAMUSCULAR | Status: AC
  Filled 2011-10-02: qty 5

## 2011-10-02 MED ORDER — HYDROMORPHONE HCL PF 1 MG/ML IJ SOLN
INTRAMUSCULAR | Status: AC
  Administered 2011-10-02: 0.25 mg via INTRAVENOUS
  Filled 2011-10-02: qty 1

## 2011-10-02 MED ORDER — FENTANYL CITRATE 0.05 MG/ML IJ SOLN
INTRAMUSCULAR | Status: DC | PRN
Start: 2011-10-02 — End: 2011-10-02
  Administered 2011-10-02 (×2): 25 ug via INTRAVENOUS
  Administered 2011-10-02: 100 ug via INTRAVENOUS
  Administered 2011-10-02: 25 ug via INTRAVENOUS
  Administered 2011-10-02: 100 ug via INTRAVENOUS
  Administered 2011-10-02: 50 ug via INTRAVENOUS
  Administered 2011-10-02: 100 ug via INTRAVENOUS
  Administered 2011-10-02: 25 ug via INTRAVENOUS

## 2011-10-02 MED ORDER — ONDANSETRON HCL 4 MG/2ML IJ SOLN: 4.0000 mg | Freq: Four times a day (QID) | INTRAMUSCULAR | Status: DC | PRN

## 2011-10-02 SURGICAL SUPPLY — 31 items
CABLE HIGH FREQUENCY MONO STRZ (ELECTRODE) IMPLANT
CHLORAPREP W/TINT 26ML (MISCELLANEOUS) ×5 IMPLANT
CLOTH BEACON ORANGE TIMEOUT ST (SAFETY) ×5 IMPLANT
CONT PATH 16OZ SNAP LID 3702 (MISCELLANEOUS) ×5 IMPLANT
COVER TABLE BACK 60X90 (DRAPES) ×5 IMPLANT
ELECT REM PT RETURN 9FT ADLT (ELECTROSURGICAL) ×5
ELECTRODE REM PT RTRN 9FT ADLT (ELECTROSURGICAL) ×4 IMPLANT
EVACUATOR PREFILTER SMOKE (MISCELLANEOUS) ×5 IMPLANT
GLOVE BIO SURGEON STRL SZ 6.5 (GLOVE) ×10 IMPLANT
GLOVE BIO SURGEON STRL SZ7 (GLOVE) ×5 IMPLANT
GLOVE ORTHO TXT STRL SZ7.5 (GLOVE) ×5 IMPLANT
GOWN PREVENTION PLUS LG XLONG (DISPOSABLE) ×20 IMPLANT
NEEDLE INSUFFLATION 14GA 120MM (NEEDLE) ×5 IMPLANT
NS IRRIG 1000ML POUR BTL (IV SOLUTION) ×5 IMPLANT
PACK LAVH (CUSTOM PROCEDURE TRAY) ×5 IMPLANT
PROTECTOR NERVE ULNAR (MISCELLANEOUS) ×5 IMPLANT
SCALPEL HARMONIC ACE (MISCELLANEOUS) ×5 IMPLANT
SET CYSTO W/LG BORE CLAMP LF (SET/KITS/TRAYS/PACK) IMPLANT
SET IRRIG TUBING LAPAROSCOPIC (IRRIGATION / IRRIGATOR) IMPLANT
SUT VIC AB 1 CT1 18XBRD ANBCTR (SUTURE) ×8 IMPLANT
SUT VIC AB 1 CT1 8-18 (SUTURE) ×2
SUT VIC AB 2-0 CT1 (SUTURE) ×5 IMPLANT
SUT VIC AB 3-0 PS2 18 (SUTURE) ×5 IMPLANT
SUT VICRYL 0 TIES 12 18 (SUTURE) ×5 IMPLANT
SUT VICRYL 0 UR6 27IN ABS (SUTURE) IMPLANT
SUT VICRYL 4-0 PS2 18IN ABS (SUTURE) ×5 IMPLANT
TOWEL OR 17X24 6PK STRL BLUE (TOWEL DISPOSABLE) ×10 IMPLANT
TRAY FOLEY CATH 14FR (SET/KITS/TRAYS/PACK) ×5 IMPLANT
TROCAR XCEL NON-BLD 5MMX100MML (ENDOMECHANICALS) ×15 IMPLANT
WARMER LAPAROSCOPE (MISCELLANEOUS) ×5 IMPLANT
WATER STERILE IRR 1000ML POUR (IV SOLUTION) ×5 IMPLANT

## 2011-10-02 NOTE — Addendum Note (Signed)
Addendum  created 10/02/11 1612 by Algis Greenhouse, CRNA   Modules edited:Notes Section

## 2011-10-02 NOTE — Anesthesia Preprocedure Evaluation (Signed)

## 2011-10-02 NOTE — Progress Notes (Signed)
Day of Surgery Procedure(s) (LRB): LAPAROSCOPY DIAGNOSTIC () HYSTERECTOMY VAGINAL (N/A)  Subjective: Patient reports tolerating PO clears, pain controlled, no nausea  Objective: I have reviewed patient's vital signs and intake and output.  General: alert GI: soft, non-tender; bowel sounds normal; no masses,  no organomegaly  Assessment: s/p Procedure(s) (LRB): LAPAROSCOPY DIAGNOSTIC () HYSTERECTOMY VAGINAL (N/A): stable  Plan: Pt doing well post-op Will advance to po meds in AM  LOS: 0 days    Raylene Carmickle W 10/02/2011, 6:02 PM

## 2011-10-02 NOTE — Anesthesia Procedure Notes (Signed)
Procedure Name: Intubation Date/Time: 10/02/2011 9:20 AM Performed by: Cephus Shelling A Pre-anesthesia Checklist: Patient identified, Patient being monitored, Emergency Drugs available and Suction available Patient Re-evaluated:Patient Re-evaluated prior to inductionOxygen Delivery Method: Circle system utilized Preoxygenation: Pre-oxygenation with 100% oxygen Intubation Type: IV induction and Inhalational induction Ventilation: Mask ventilation without difficulty Laryngoscope Size: Mac and 3 Grade View: Grade I Tube type: Oral Tube size: 7.0 mm Number of attempts: 1 Airway Equipment and Method: Stylet Placement Confirmation: ETT inserted through vocal cords under direct vision,  positive ETCO2 and breath sounds checked- equal and bilateral Secured at: 20 cm Tube secured with: Tape Dental Injury: Teeth and Oropharynx as per pre-operative assessment

## 2011-10-02 NOTE — Transfer of Care (Signed)
Immediate Anesthesia Transfer of Care Note  Patient: Christie [name]  Procedure(s) Performed: Procedure(s) (LRB): LAPAROSCOPY DIAGNOSTIC () HYSTERECTOMY VAGINAL (N/A)  Patient Location: PACU  Anesthesia Type: General  Level of Consciousness: sedated  Airway & Oxygen Therapy: Patient Spontanous Breathing and Patient connected to nasal cannula oxygen  Post-op Assessment: Report given to PACU RN  Post vital signs: Reviewed and stable  Complications: No apparent anesthesia complications

## 2011-10-02 NOTE — Anesthesia Postprocedure Evaluation (Signed)
  Anesthesia Post-op Note  Patient: Christie [name]  Procedure(s) Performed: Procedure(s) (LRB): LAPAROSCOPY DIAGNOSTIC () HYSTERECTOMY VAGINAL (N/A)  Patient Location: Women[facility] Unit  Anesthesia Type: General  Level of Consciousness: sedated  Airway and Oxygen Therapy: Patient Spontanous Breathing and Patient connected to nasal cannula oxygen  Post-op Pain: mild  Post-op Assessment: Post-op Vital signs reviewed  Post-op Vital Signs: Reviewed and stable  Complications: No apparent anesthesia complications

## 2011-10-02 NOTE — H&P (Addendum)
Christie [name] is an 36 y.o. female [phone] with dysmenorrhea/dyspareunia for definitive management LAVH +/- BSO, possible fulguration of endometriosis.    Pertinent Gynecological History: Menses: dysmenorrhea Bleeding: irregular VB, q 2 month Contraception: tubal ligation DES exposure: denies Blood transfusions: none Sexually transmitted diseases: no past history Previous GYN Procedures: LEEP  Last pap: normal Date: 4/13, HR HPV neg OB History: G6, P3124   Menstrual History: Menarche age: 61 No LMP recorded.    Past Medical History  Diagnosis Date  . Elevated cholesterol   . Abnormal Pap smear     history  . Dysmenorrhea 10/02/2011  . Dyspareunia, female 10/02/2011    Past Surgical History  Procedure Date  . Svd     x 4  . Tubal ligation 09/2006  . Diagnostic laparoscopy 06/2003    tx of endometriosis  . Hernia repair 06/2003    umbilical hernia repair  . Hernia repair 12/2007    recurrent umbilical ventral hernia w/mesh  . Colonoscopy 2003  TAB x 2 LEEP   Social History:  reports that she has never smoked. She has never used smokeless tobacco. She reports that she drinks alcohol. She reports that she uses illicit drugs.  Allergies: No Known Allergies  No prescriptions prior to admission  FH DM 2, HTN  Review of Systems  Constitutional: Negative.   HENT: Negative.   Eyes: Negative.   Respiratory: Negative.   Cardiovascular: Negative.   Gastrointestinal: Negative.   Genitourinary: Negative.   Musculoskeletal: Negative.   Skin: Negative.   Neurological: Negative.   Psychiatric/Behavioral: Negative.     There were no vitals taken for this visit. Physical Exam  Constitutional: She is oriented to person, place, and time. She appears well-developed and well-nourished.  HENT:  Head: Normocephalic and atraumatic.  Eyes: Conjunctivae are normal. Pupils are equal, round, and reactive to light.  Neck: Normal range of motion. Neck supple. No thyromegaly present.    Cardiovascular: Normal rate and regular rhythm.   Respiratory: Effort normal and breath sounds normal. No respiratory distress.  GI: Soft. Bowel sounds are normal. She exhibits no distension. There is no tenderness.  Musculoskeletal: Normal range of motion.  Neurological: She is alert and oriented to person, place, and time.  Skin: Skin is warm and dry.  Psychiatric: She has a normal mood and affect. Her behavior is normal.    No results found for this or any previous visit (from the past 24 hour(s)).  No results found.  Assessment/Plan: 96EX [id number] with dysmenorrhea and dyspaurenia for definitive management LAVH +/- BSO possible fulguration of endometriosis.  D/W pt r/b/a of surgery, pt voices understanding  D/w pt mesh at umbilical hernia repair, h/o endometriosis, try for LUQ entry  Christie [name] 10/02/2011, 12:16 AM  H&P reviewed, no changes.  Procedure reviewed, wants to proceed

## 2011-10-02 NOTE — Interval H&P Note (Signed)
History and Physical Interval Note:  10/02/2011 9:11 AM  Christie [name]  has presented today for surgery, with the diagnosis of dysmenorrhea, dyspareunia  The various methods of treatment have been discussed with the patient and family. After consideration of risks, benefits and other options for treatment, the patient has consented to  Procedure(s) (LRB): LAPAROSCOPIC ASSISTED VAGINAL HYSTERECTOMY (N/A) LAPAROSCOPIC BILATERAL SALPINGO OOPHERECTOMY (Bilateral) as a surgical intervention .  The patients' history has been reviewed, patient examined, no change in status, stable for surgery.  I have reviewed the patients' chart and labs.  Questions were answered to the patient's satisfaction.     [name],Ebony Yorio

## 2011-10-02 NOTE — Anesthesia Postprocedure Evaluation (Signed)
Anesthesia Post Note  Patient: Christie [name]  Procedure(s) Performed: Procedure(s) (LRB): LAPAROSCOPY DIAGNOSTIC () HYSTERECTOMY VAGINAL (N/A)  Anesthesia type: General  Patient location: PACU  Post pain: Pain level controlled  Post assessment: Post-op Vital signs reviewed  Last Vitals:  Filed Vitals:   10/02/11 1215  BP: 116/74  Pulse: 78  Temp:   Resp: 18    Post vital signs: Reviewed  Level of consciousness: sedated  Complications: No apparent anesthesia complications

## 2011-10-02 NOTE — Brief Op Note (Signed)
10/02/2011  11:07 AM  PATIENT:  Christie [name]  36 y.o. female  PRE-OPERATIVE DIAGNOSIS:  dysmenorrhea, dyspareunia  POST-OPERATIVE DIAGNOSIS:  dysmenorrhea, dyspareunia  PROCEDURE:  Procedure(s) (LRB): LAPAROSCOPY DIAGNOSTIC () HYSTERECTOMY VAGINAL (N/A)  SURGEON:  Surgeon(s) and Role:    * Sherron Monday, MD - Primary    * Lavina Hamman, MD - Assisting   ANESTHESIA:   general  EBL:  Total I/O In: 2000 [I.V.:2000] Out: 450 [Urine:250; Blood:200]  DRAINS: Urinary Catheter (Foley)   LOCAL MEDICATIONS USED:  MARCAINE     SPECIMEN:  Source of Specimen:  uterus and cervix  DISPOSITION OF SPECIMEN:  PATHOLOGY  COUNTS:  YES  FINDINGS: 6-8 wk size uterus, nl tubes and ovaries, omental umbilical mesh adhesions, no endometriosis noted  DICTATION: .Other Dictation: Dictation Number [id number]  PLAN OF CARE: Admit for overnight observation  PATIENT DISPOSITION:  PACU - hemodynamically stable.   Delay start of Pharmacological VTE agent (>24hrs) due to surgical blood loss or risk of bleeding: not applicable

## 2011-10-03 ENCOUNTER — Encounter (HOSPITAL_COMMUNITY): Payer: Self-pay | Admitting: Obstetrics and Gynecology

## 2011-10-03 LAB — CBC
HCT: 32.7 % — ABNORMAL LOW (ref 36.0–46.0)
MCHC: 32.1 g/dL (ref 30.0–36.0)
MCV: 79.8 fL (ref 78.0–100.0)
RDW: 15.4 % (ref 11.5–15.5)

## 2011-10-03 LAB — BASIC METABOLIC PANEL
BUN: 8 mg/dL (ref 6–23)
CO2: 26 mEq/L (ref 19–32)
Chloride: 105 mEq/L (ref 96–112)
Creatinine, Ser: 0.9 mg/dL (ref 0.50–1.10)

## 2011-10-03 MED ORDER — IBUPROFEN 800 MG PO TABS: 800.0000 mg | ORAL_TABLET | Freq: Three times a day (TID) | ORAL | Status: AC | PRN

## 2011-10-03 MED ORDER — OXYCODONE-ACETAMINOPHEN 5-325 MG PO TABS: 1.0000 | ORAL_TABLET | ORAL | Status: AC | PRN

## 2011-10-03 NOTE — Discharge Summary (Signed)
Physician Discharge Summary  Patient ID: Christie [name] MRN: [id number] DOB/AGE: [birth year] 36 y.o.  Admit date: 10/02/2011 Discharge date: 10/03/2011  Admission Diagnoses: dysmenorrhea, dyspaurenia  Discharge Diagnoses:  Principal Problem:  *Dyspareunia, female Active Problems:  Dysmenorrhea s/p vaginal hysterectomy  Discharged Condition: stable  Hospital Course: admitted for surgery, underwent TVH, dx laparoscopy, d/c home post op day 1  Consults: None  Significant Diagnostic Studies: labs: CBC, BMP  Treatments: surgery: TVH, dx laparoscopy  Discharge Exam: Blood pressure 126/71, pulse 73, temperature 98 F (36.7 C), temperature source Oral, resp. rate 18, height 5\' 3"  (1.6 m), weight 77.111 kg (170 lb), last menstrual period 09/03/2011, SpO2 100.00%. General appearance: alert and no distress Resp: clear to auscultation bilaterally Cardio: regular rate and rhythm GI: soft, non-tender; bowel sounds normal; no masses,  no organomegaly Extremities: extremities normal, atraumatic, no cyanosis or edema Incision/Wound:C/D/I  Disposition: home  Discharge Orders    Future Orders Please Complete By Expires   Diet - low sodium heart healthy      Increase activity slowly      Discharge instructions      Comments:   Call [phone] with questions or problems   May walk up steps      May shower / Bathe      Driving Restrictions      Comments:   While taking strong pain medicine   Lifting restrictions      Comments:   No greater than 25-35 lbs   Sexual Activity Restrictions      Comments:   Pelvic rest - no douching, tampons or sex for 6 weeks   Call MD for:  persistant nausea and vomiting      Call MD for:  severe uncontrolled pain        Medication List  As of 10/03/2011  9:35 AM   TAKE these medications         fenofibrate 160 MG tablet   Take 160 mg by mouth daily.      ibuprofen 200 MG tablet   Commonly known as: ADVIL,MOTRIN   Take 400 mg by mouth daily as  needed. For headache      ibuprofen 800 MG tablet   Commonly known as: ADVIL,MOTRIN   Take 1 tablet (800 mg total) by mouth every 8 (eight) hours as needed (pain).      oxyCODONE-acetaminophen 5-325 MG per tablet   Commonly known as: PERCOCET   Take 1-2 tablets by mouth every 4 (four) hours as needed (moderate to severe pain (when tolerating fluids)).           Follow-up Information    Follow up with [name],Raenell Mensing, MD. Schedule an appointment as soon as possible for a visit in 2 weeks.   Contact information:   [address] [facility] [address] [place] [address] [phone]          Signed: Sherron Monday 10/03/2011, 9:35 AM

## 2011-10-03 NOTE — Op Note (Signed)
[name], [name]              ACCOUNT NO.:  [id number]  MEDICAL RECORD NO.:  [id number]  LOCATION:  9306                          FACILITY:  [facility]  PHYSICIAN:  Sherron Monday, MD        DATE OF BIRTH:  [birth year]  DATE OF PROCEDURE:  10/02/2011 DATE OF DISCHARGE:                              OPERATIVE REPORT   PREOPERATIVE DIAGNOSIS:  Dysmenorrhea, dyspareunia.  POSTOPERATIVE DIAGNOSIS:  Dysmenorrhea, dyspareunia.  PROCEDURE:  Diagnostic laparoscopy, total vaginal hysterectomy.  SURGEON:  Sherron Monday, MD  ASSISTANT:  Zenaida Niece, MD  ANESTHESIA:  General.  EBL:  200 mL.  IV FLUIDS:  2000 mL.  URINE OUTPUT:  250 mL.  SPECIMEN:  Uterus and cervix to pathology.  COMPLICATIONS:  None.  FINDINGS:  A 6-8 weeks size uterus, normal tubes and ovaries, omental umbilical mesh adhesions and no endometriosis noted.  DISPOSITION:  Stable to the PACU.  PROCEDURE:  After informed consent was reviewed with the patient including the risks, benefits, and alternatives of the surgical procedure, she was transported to the OR, placed on the table in supine position.  Her arms were tucked and feet were placed in Yellofin stirrups.  General anesthesia was then induced, found to be adequate. She was then prepped and draped in the normal sterile fashion.  Her bladder was sterilely drained.  Using an open-sided speculum, her cervix was easily identified and a Hulka tenaculum was placed.  Attention was turned to the abdominal portion of the case and approximately 5-mm incision was made at palmer's point, approximately 3 cm subcostal at the midclavicular line.  Using a Veress needle, pneumoperitoneum was obtained after the hanging drop test had been performed.  After pneumoperitoneum was obtained, a 5-mm trocar was placed with direct visualization.  A pelvic survey was performed revealing multiple omental to anterior abdominal wall where umbilical hernia mesh was placed. Adhesions  obscuring the view of the pelvis, however, able to look  around these adhesions and normal uterus, tubes, and ovaries were noted. Cul-de-sacs were inspected.  No endometriosis was visualized.  Normal ovaries and tubes bilaterally were noted.  The camera was removed. Attention was turned to perform the vaginal portion of the case.  Using a short-weighted speculum and a Sims retractor after the tenaculum was removed, her cervix was grasped with Christie [name] tenaculum, straight and curved, and the cervix was circumscribed with Bovie cautery.  Attempt was made to enter anteriorly and attention was then turned to entering posteriorly, the posterior cul-de-sac was easily entered.  A stay suture was placed at approximately 6 o'clock and a banana speculum was placed. The uterosacral ligaments were plicated bilaterally.  Then in a stepwise fashion.  The cardinal ligaments and uterine vessels were ligated up to the level of the cornu.  The uterus was grasped with a towel clamp and flipped.  At the level of the cornu, the tubes and ovaries were excised first with suture and doubly ligated with a stick tie.  This was performed bilaterally and was noted to be hemostatic. The pedicles were inspected and found to be hemostatic.  Bleeding on the posterior cuff was made hemostatic with Bovie cautery and a suture of 0  Vicryl.  An uterosacral ligament plication was performed.  The held sutures were then tied together.  The cuff was noted to be hemostatic and using 2-0 Vicryl, the vaginal cuff was closed in a running locked fashion.  Foley catheter was sterilely placed.  Attention was returned to the abdomen.  The area was inspected, found to be hemostatic, no obvious bleeding.  The patient tolerated the procedure well.  Sponge, lap, and needle counts were correct x2.     Sherron Monday, MD     JB/MEDQ  D:  10/02/2011  T:  10/03/2011  Job:  [id number]

## 2011-10-03 NOTE — Progress Notes (Signed)
1 Day Post-Op Procedure(s) (LRB): LAPAROSCOPY DIAGNOSTIC () HYSTERECTOMY VAGINAL (N/A)  Subjective: Patient reports incisional pain, tolerating PO and no problems voiding.  Pain controlled  Objective: I have reviewed patient's vital signs, intake and output and labs.  General: alert and no distress Resp: clear to auscultation bilaterally Cardio: regularly irregular rhythm GI: soft, non-tender; bowel sounds normal; no masses,  no organomegaly and incision: clean, dry and intact Extremities: extremities normal, atraumatic, no cyanosis or edema  Assessment: s/p Procedure(s) (LRB): LAPAROSCOPY DIAGNOSTIC () HYSTERECTOMY VAGINAL (N/A): stable  Plan: Advance diet Encourage ambulation Discontinue IV fluids Discharge home d/c foley  LOS: 1 day    [name],Monee Dembeck 10/03/2011, 9:26 AM

## 2011-10-03 NOTE — Progress Notes (Signed)
UR chart review completed.  

## 2011-10-03 NOTE — Progress Notes (Signed)
Pt is discharged in the care of friend , per ambulatory. Stable, Spirits are good. Denies any excessive vaginal bleeding., or pain. Voiding and ambulating with out difficulty.

## 2012-08-18 ENCOUNTER — Other Ambulatory Visit: Payer: Self-pay | Admitting: Otolaryngology

## 2012-08-18 DIAGNOSIS — K219 Gastro-esophageal reflux disease without esophagitis: Secondary | ICD-10-CM

## 2012-08-27 ENCOUNTER — Ambulatory Visit
Admission: RE | Admit: 2012-08-27 | Discharge: 2012-08-27 | Disposition: A | Discharge status: Home / Self Care | Payer: Managed Care, Other (non HMO) | Source: Ambulatory Visit | Attending: Otolaryngology | Admitting: Otolaryngology

## 2012-08-27 DIAGNOSIS — K219 Gastro-esophageal reflux disease without esophagitis: Secondary | ICD-10-CM

## 2013-04-02 ENCOUNTER — Other Ambulatory Visit: Payer: Self-pay | Admitting: Family Medicine

## 2013-04-02 DIAGNOSIS — N6009 Solitary cyst of unspecified breast: Secondary | ICD-10-CM

## 2013-04-02 DIAGNOSIS — N644 Mastodynia: Secondary | ICD-10-CM

## 2013-04-21 ENCOUNTER — Ambulatory Visit
Admission: RE | Admit: 2013-04-21 | Discharge: 2013-04-21 | Disposition: A | Discharge status: Home / Self Care | Payer: Private Health Insurance - Indemnity | Source: Ambulatory Visit | Attending: Family Medicine | Admitting: Family Medicine

## 2013-04-21 DIAGNOSIS — N6009 Solitary cyst of unspecified breast: Secondary | ICD-10-CM

## 2013-04-21 DIAGNOSIS — N644 Mastodynia: Secondary | ICD-10-CM

## 2014-05-18 ENCOUNTER — Encounter: Payer: Self-pay | Admitting: Gastroenterology

## 2014-07-09 ENCOUNTER — Ambulatory Visit: Payer: Private Health Insurance - Indemnity | Admitting: Gastroenterology

## 2014-07-09 ENCOUNTER — Encounter: Payer: Self-pay | Admitting: Gastroenterology

## 2014-07-31 IMAGING — MG MM DIAGNOSTIC BILATERAL
4 series · 4 of 4 positions shown · non-contrast
Comparison: None.

CLINICAL DATA: Diffuse left greater than right bilateral breast
pain.

EXAM:
DIGITAL DIAGNOSTIC  BILATERAL MAMMOGRAM

[R CC]
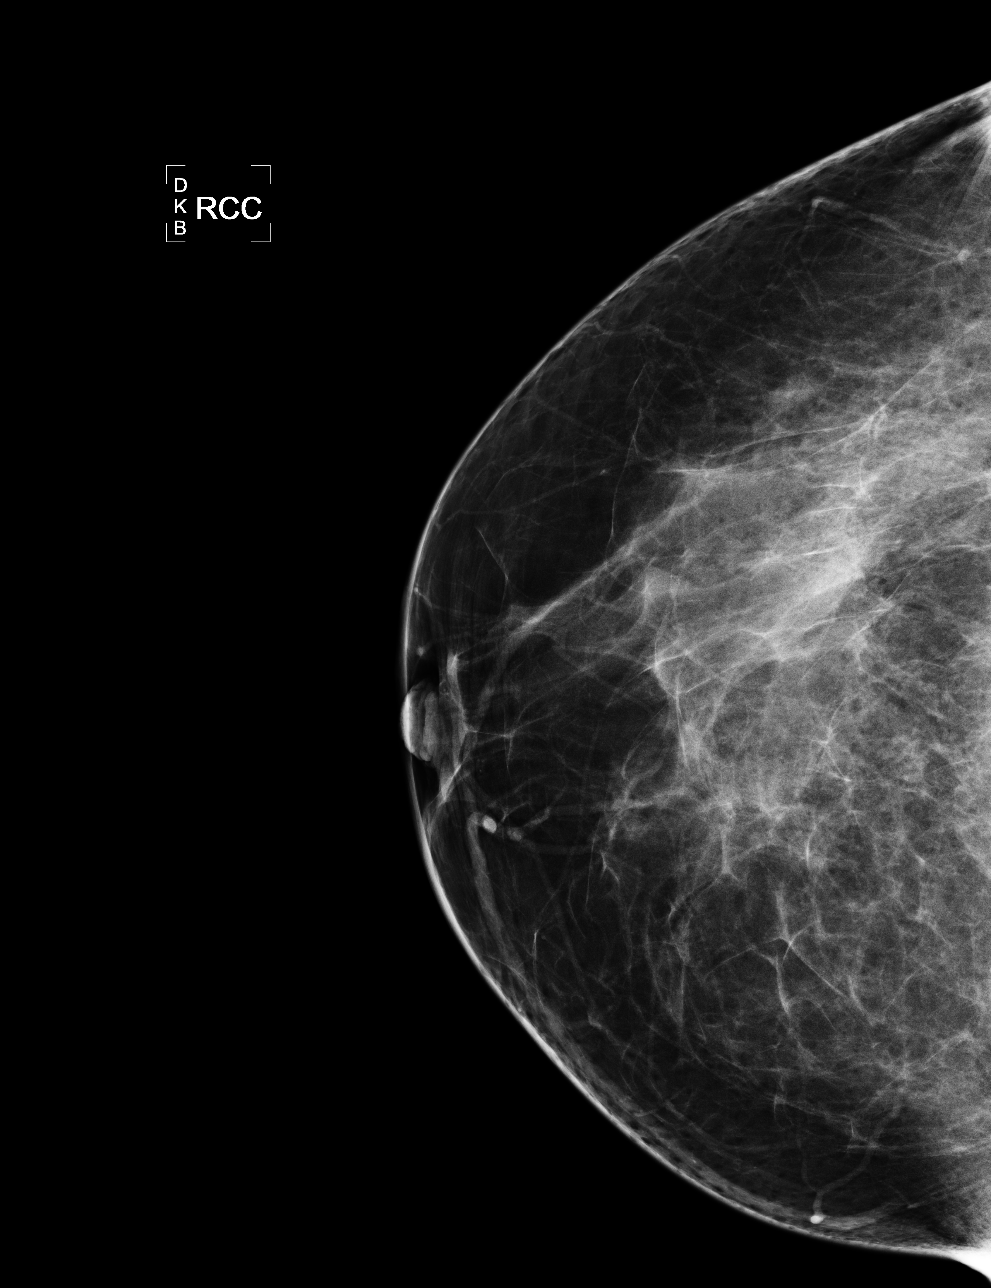

[L CC]
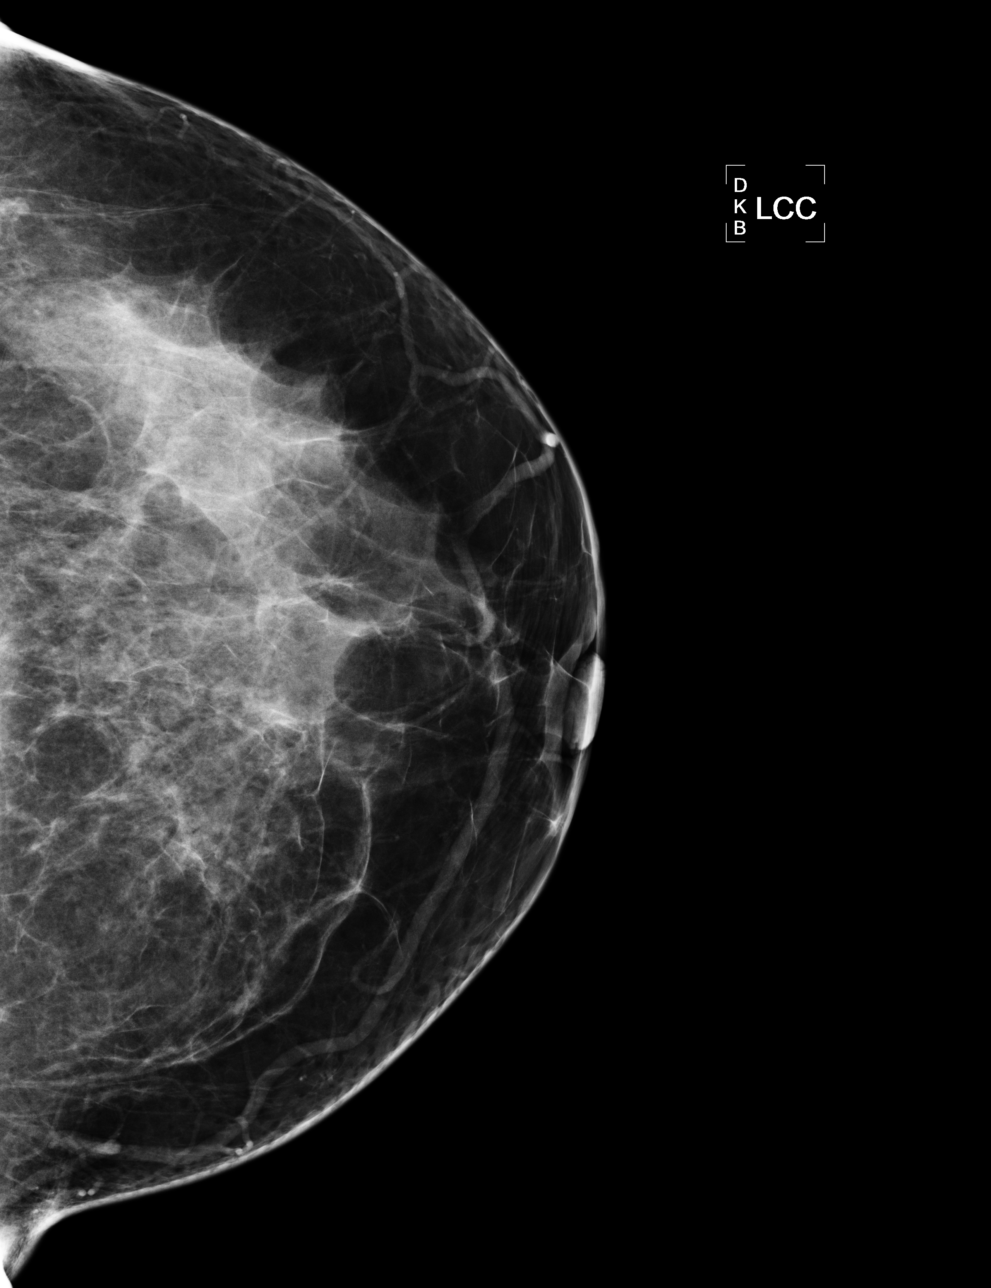

[L MLO]
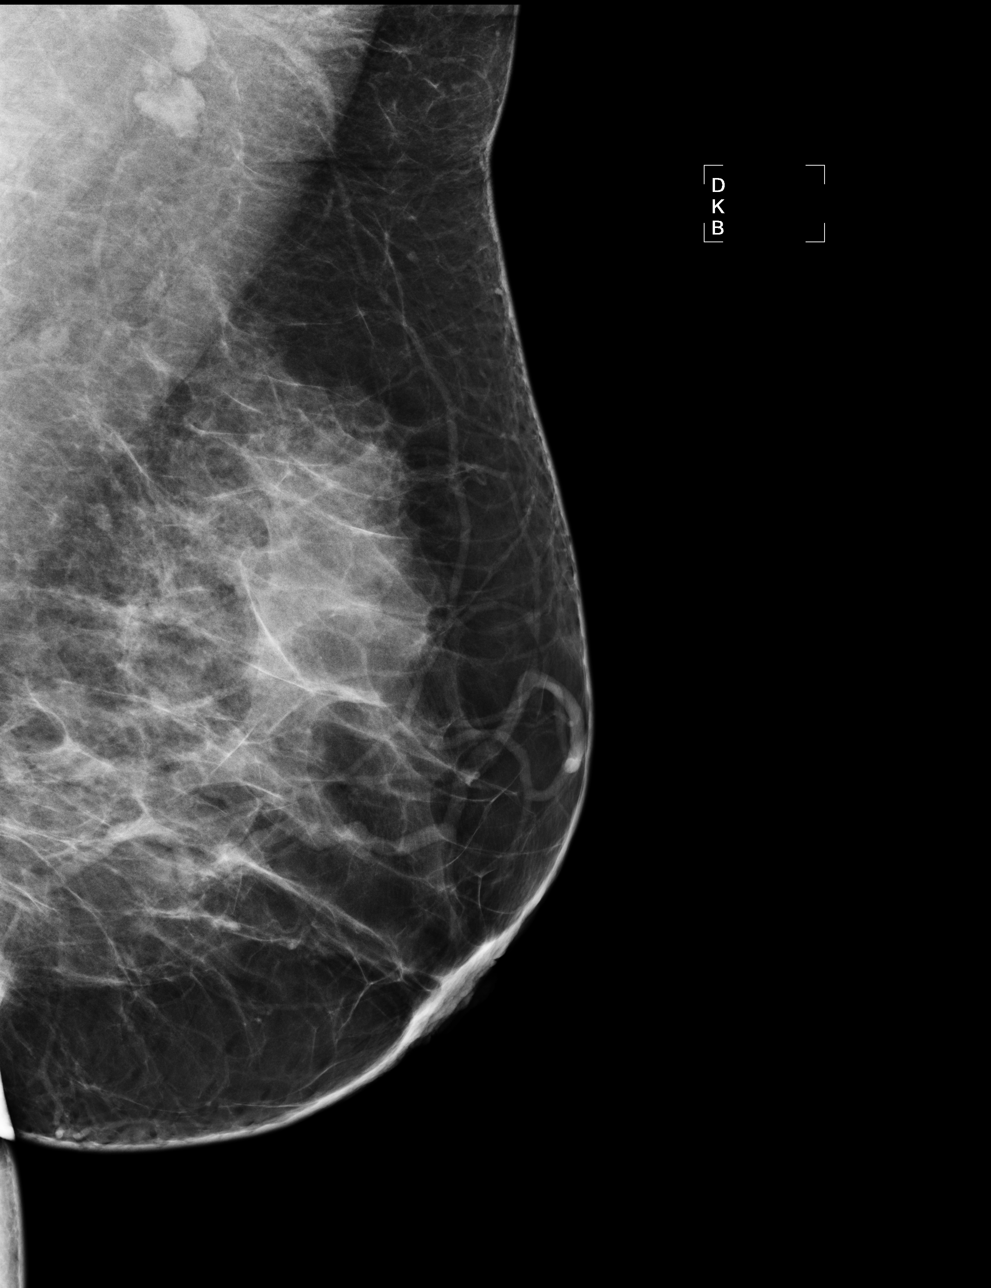

[R MLO]
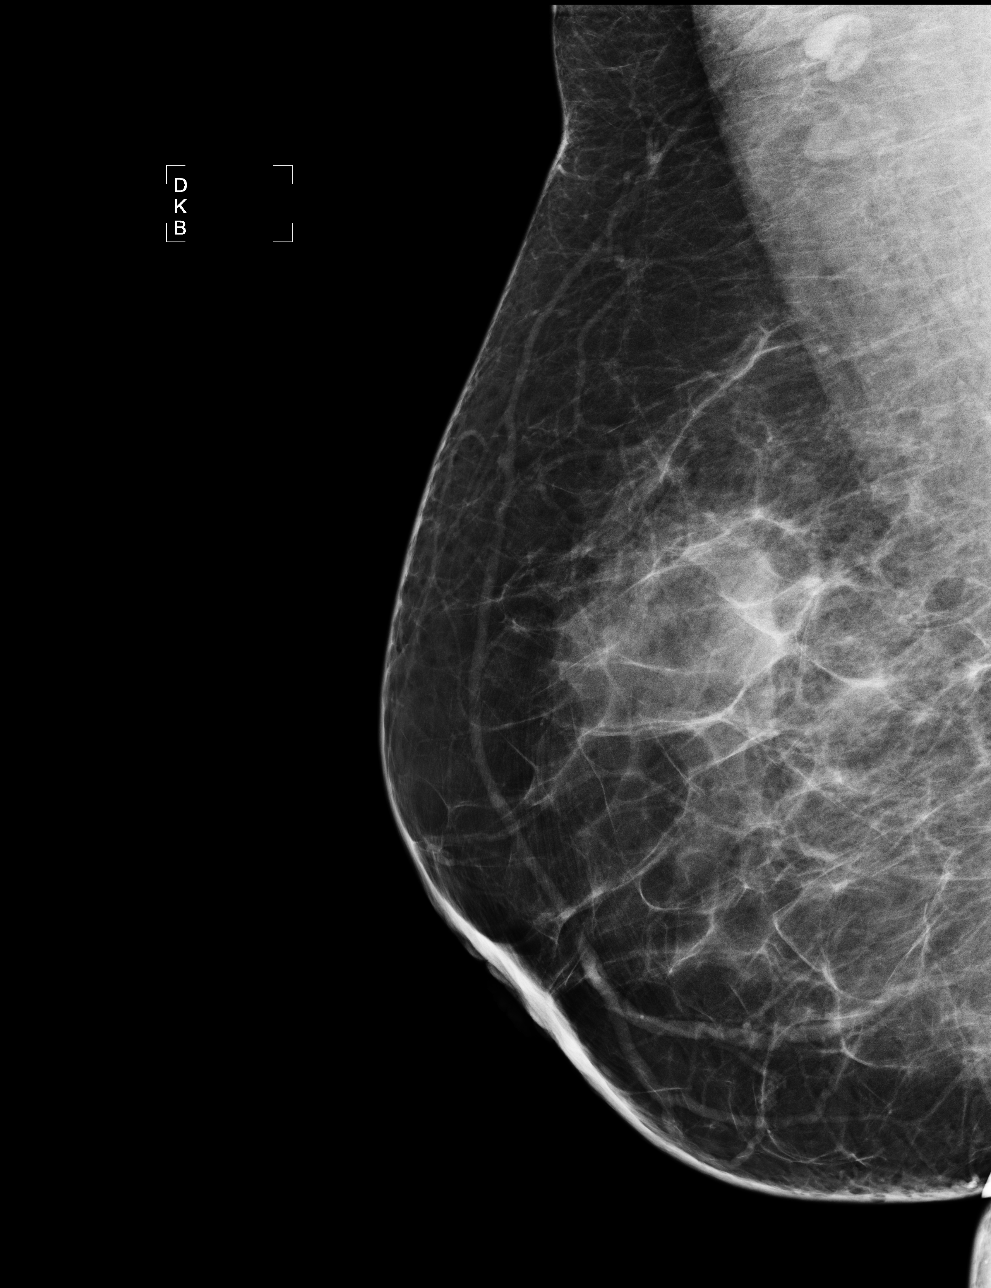

[4 of 4 positions shown; findings below may reference images not displayed]

ACR Breast Density Category c: The breasts are heterogeneously
dense, which may obscure small masses.
FINDINGS: No suspicious masses or calcifications are seen in either breast.
There is no mammographic evidence of malignancy in either breast.
IMPRESSION: No mammographic evidence of malignancy in either breast.

RECOMMENDATION:
1. Recommend further decisions/ management of the left greater than
right nonfocal breast pain be based on clinical grounds.

2. Screening mammogram at age 41 unless there are persistent or
intervening clinical concerns. (Code:IR-4-WQ9)

I have discussed the findings and recommendations with the patient.
Results were also provided in writing at the conclusion of the
visit. If applicable, a reminder letter will be sent to the patient
regarding the next appointment.

BI-RADS CATEGORY  1: Negative

## 2014-08-25 ENCOUNTER — Ambulatory Visit (INDEPENDENT_AMBULATORY_CARE_PROVIDER_SITE_OTHER): Payer: BLUE CROSS/BLUE SHIELD | Admitting: Gastroenterology

## 2014-08-25 ENCOUNTER — Encounter: Payer: Self-pay | Admitting: Gastroenterology

## 2014-08-25 VITALS — BP 142/100 | HR 72 | Ht 63.0 in | Wt 168.1 lb

## 2014-08-25 DIAGNOSIS — R1032 Left lower quadrant pain: Secondary | ICD-10-CM | POA: Insufficient documentation

## 2014-08-25 MED ORDER — HYOSCYAMINE SULFATE ER 0.375 MG PO TB12: ORAL_TABLET | ORAL | Status: DC

## 2014-08-25 NOTE — Assessment & Plan Note (Signed)
Asian has persistent bilateral lower abdominal pain unaccompanied by alarm symptoms.  No bleeding on the toilet tissue is probably hemorrhoidal.  At this point will treat empirically with anticholinergics and hold further GI workup unless she does not respond.

## 2014-08-25 NOTE — Patient Instructions (Signed)
Follow up with Dr Arlyce DiceKaplan on 11/01/2014 at 8:45am Your prescription will be sent to your pharmacy

## 2014-08-25 NOTE — Progress Notes (Signed)
_                                                                                                                History of Present Illness:  Christie [name] is a 39 year old Afro-American female referred at the request of Dr. Manus GunningEhinger for evaluation of abdominal complaints.  She is suffering from very frequent crampy lower abdominal pain.  n pain is  unrelated to meals and tends to be fairly constant.  Initially she had loose stools but these improved after starting omeprazole.  Stools are soft.  On rare occasions she has seen blood on the toilet tissue.  There has been no other changes in medications or diet.  She takes ibuprofen sparingly.    Colonoscopy in 2003 was unremarkable.  In 2004 she underwent banding of internal hemorrhoids.  2014 esophagogram straightest significant reflux.   Past Medical History  Diagnosis Date  . Elevated cholesterol   . Abnormal Pap smear     history  . Dysmenorrhea 10/02/2011  . Dyspareunia, female 10/02/2011  . Hypertension    Past Surgical History  Procedure Laterality Date  . Svd      x 4  . Tubal ligation  09/2006  . Diagnostic laparoscopy  06/2003    tx of endometriosis  . Umbilical hernia repair  06/2003    umbilical hernia repair  . Laparoscopic incisional / umbilical / ventral hernia repair  12/2007    recurrent umbilical ventral hernia w/mesh  . Colonoscopy  2003  . Laparoscopy  10/02/2011    Procedure: LAPAROSCOPY DIAGNOSTIC;  Surgeon: Sherron MondayJody [name], MD;  Location: [facility];  Service: Gynecology;;  . Vaginal hysterectomy  10/02/2011    Procedure: HYSTERECTOMY VAGINAL;  Surgeon: Sherron MondayJody [name], MD;  Location: [facility];  Service: Gynecology;  Laterality: N/A;   family history includes Diabetes in her sister; GER disease in her mother; Hypertension in her mother. Current Outpatient Prescriptions  Medication Sig Dispense Refill  . fenofibrate 160 MG tablet Take 160 mg by mouth daily.    Marland Kitchen. ibuprofen (ADVIL,MOTRIN) 200 MG tablet Take 400 mg by mouth  daily as needed. For headache    . lisinopril (PRINIVIL,ZESTRIL) 20 MG tablet Take 1 tablet by mouth daily.    Marland Kitchen. omeprazole (PRILOSEC) 20 MG capsule Take 1 capsule by mouth 2 (two) times daily.     No current facility-administered medications for this visit.   Allergies as of 08/25/2014  . (No Known Allergies)    reports that she has never smoked. She has never used smokeless tobacco. She reports that she drinks alcohol. She reports that she does not use illicit drugs.   Review of Systems: Pertinent positive and negative review of systems were noted in the above HPI section. All other review of systems were otherwise negative.  Vital signs were reviewed in today's medical record Physical Exam: General: Well developed , well nourished, no acute distress Skin: anicteric Head: Normocephalic and atraumatic Eyes:  sclerae anicteric, EOMI Ears: Normal auditory acuity Mouth: No deformity or  lesions Neck: Supple, no masses or thyromegaly Lungs: Clear throughout to auscultation Heart: Regular rate and rhythm; no murmurs, rubs or bruits Abdomen: Soft, and non distended. No masses, hepatosplenomegaly or hernias noted. Normal Bowel sounds.  There is mild Unasyn the left lower quadrant and right lower quadrants to light palpation Rectal:deferred Musculoskeletal: Symmetrical with no gross deformities  Skin: No lesions on visible extremities Pulses:  Normal pulses noted Extremities: No clubbing, cyanosis, edema or deformities noted Neurological: Alert oriented x 4, grossly nonfocal Cervical Nodes:  No significant cervical adenopathy Inguinal Nodes: No significant inguinal adenopathy Psychological:  Alert and cooperative. Normal mood and affect  See Assessment and Plan under Problem List

## 2014-11-01 ENCOUNTER — Telehealth: Payer: Self-pay | Admitting: Gastroenterology

## 2014-11-01 ENCOUNTER — Ambulatory Visit: Payer: BLUE CROSS/BLUE SHIELD | Admitting: Gastroenterology

## 2014-11-01 NOTE — Telephone Encounter (Signed)
A user error has taken place.

## 2015-01-05 ENCOUNTER — Ambulatory Visit: Payer: BLUE CROSS/BLUE SHIELD | Admitting: Podiatry

## 2015-01-19 ENCOUNTER — Ambulatory Visit: Payer: BLUE CROSS/BLUE SHIELD | Admitting: Gastroenterology

## 2017-06-04 ENCOUNTER — Other Ambulatory Visit: Payer: Self-pay | Admitting: Physician Assistant

## 2017-06-04 DIAGNOSIS — Z1231 Encounter for screening mammogram for malignant neoplasm of breast: Secondary | ICD-10-CM

## 2017-07-08 ENCOUNTER — Ambulatory Visit
Admission: RE | Admit: 2017-07-08 | Discharge: 2017-07-08 | Disposition: A | Discharge status: Home / Self Care | Payer: BLUE CROSS/BLUE SHIELD | Source: Ambulatory Visit | Attending: Physician Assistant | Admitting: Physician Assistant

## 2017-07-08 DIAGNOSIS — Z1231 Encounter for screening mammogram for malignant neoplasm of breast: Secondary | ICD-10-CM

## 2017-07-24 ENCOUNTER — Encounter: Payer: Self-pay | Admitting: Physician Assistant

## 2017-09-09 ENCOUNTER — Ambulatory Visit: Payer: Self-pay | Admitting: Gastroenterology

## 2018-06-30 ENCOUNTER — Other Ambulatory Visit: Payer: Self-pay | Admitting: Nurse Practitioner

## 2018-06-30 DIAGNOSIS — Z1231 Encounter for screening mammogram for malignant neoplasm of breast: Secondary | ICD-10-CM

## 2020-07-04 ENCOUNTER — Other Ambulatory Visit: Payer: Self-pay | Admitting: Physician Assistant

## 2020-07-04 DIAGNOSIS — Z1231 Encounter for screening mammogram for malignant neoplasm of breast: Secondary | ICD-10-CM

## 2020-09-06 ENCOUNTER — Other Ambulatory Visit: Payer: Self-pay

## 2020-09-06 ENCOUNTER — Ambulatory Visit
Admission: RE | Admit: 2020-09-06 | Discharge: 2020-09-06 | Disposition: A | Discharge status: Home / Self Care | Payer: BC Managed Care – PPO | Source: Ambulatory Visit | Attending: Physician Assistant | Admitting: Physician Assistant

## 2020-09-06 DIAGNOSIS — Z1231 Encounter for screening mammogram for malignant neoplasm of breast: Secondary | ICD-10-CM

## 2020-09-22 ENCOUNTER — Ambulatory Visit: Payer: BC Managed Care – PPO | Admitting: Nurse Practitioner

## 2020-10-12 ENCOUNTER — Encounter: Payer: Self-pay | Admitting: Nurse Practitioner

## 2020-10-12 ENCOUNTER — Ambulatory Visit (INDEPENDENT_AMBULATORY_CARE_PROVIDER_SITE_OTHER): Payer: BC Managed Care – PPO | Admitting: Nurse Practitioner

## 2020-10-12 ENCOUNTER — Other Ambulatory Visit (INDEPENDENT_AMBULATORY_CARE_PROVIDER_SITE_OTHER): Payer: BC Managed Care – PPO

## 2020-10-12 VITALS — BP 134/90 | HR 84 | Ht 62.25 in | Wt 185.4 lb

## 2020-10-12 DIAGNOSIS — R195 Other fecal abnormalities: Secondary | ICD-10-CM

## 2020-10-12 DIAGNOSIS — R140 Abdominal distension (gaseous): Secondary | ICD-10-CM

## 2020-10-12 DIAGNOSIS — R1030 Lower abdominal pain, unspecified: Secondary | ICD-10-CM

## 2020-10-12 DIAGNOSIS — R143 Flatulence: Secondary | ICD-10-CM

## 2020-10-12 LAB — CBC
HCT: 37.2 % (ref 36.0–46.0)
Hemoglobin: 12.3 g/dL (ref 12.0–15.0)
MCHC: 33 g/dL (ref 30.0–36.0)
MCV: 81.6 fl (ref 78.0–100.0)
Platelets: 239 10*3/uL (ref 150.0–400.0)
RBC: 4.56 Mil/uL (ref 3.87–5.11)
RDW: 14.1 % (ref 11.5–15.5)
WBC: 6.1 10*3/uL (ref 4.0–10.5)

## 2020-10-12 MED ORDER — METRONIDAZOLE 250 MG PO TABS: 250.0000 mg | ORAL_TABLET | Freq: Three times a day (TID) | ORAL | 0 refills | Status: AC

## 2020-10-12 MED ORDER — DICYCLOMINE HCL 10 MG PO CAPS: 10.0000 mg | ORAL_CAPSULE | Freq: Two times a day (BID) | ORAL | 2 refills | Status: DC | PRN

## 2020-10-12 NOTE — Progress Notes (Signed)
ASSESSMENT AND PLAN    #45 year old female with chronic ( years) of gas like lower abdominal pain,  bloating, and postprandial loose stools despite avoidance of trigger foods. IBS?  SIBO?  --Celiac unlikely but will check tTg, IgA --CBC --Trial of low-dose dicyclomine for abdominal discomfort --We discussed testing for SIBO versus treating empirically.  She will try 1 week of metronidazole and see if it makes any difference -- Follow-up with me in 4 weeks   # GERD with heartburn. Controlled on daily PPI. She gets recurrent symptoms off PPI for more then two days.    HISTORY OF PRESENT ILLNESS     Chief Complaint : " It has been over 10 year since my last colonoscopy"   Christie [name] is a 45 y.o. female with a past medical history significant for HTN,  hysterectomy.  See additional PMH below.   Patient was previously followed by Dr. Arlyce Dice for chronic bloating, gas, abdominal pain, and loose stool.  She had an unremarkable colonoscopy for these symptoms in 2003.  Her symptoms have not changed over the years but she thought it may be time for colonoscopy since she has not had one in so long  Christie [name] avoids dairy, greasy foods and onions which are some of her known trigger foods. Despite avoiding certain foods she seems to always be bloated with excessive flatulence.  She has loose bowel movements after meals.  She rarely has a solid bowel movement.   She takes Imodium a few times a week to help thicken stool.  She had generalized lower abdominal discomfort which she describes as gas type pain relieved with flatus / defecation. She has not had any unexplained weight loss.  She has not seen any blood in her stool.  She gets labs done every year (last time was July 2021) and has never been told of any abnormalities. No FMH of colon cancer.     Past Medical History:  Diagnosis Date  . Abnormal Pap smear    history  . Anxiety   . Dysmenorrhea 10/02/2011  . Dyspareunia, female  10/02/2011  . Elevated cholesterol   . Hyperlipidemia   . Hypertension   . Vitamin D deficiency      Past Surgical History:  Procedure Laterality Date  . COLONOSCOPY  2003  . DIAGNOSTIC LAPAROSCOPY  06/2003   tx of endometriosis  . LAPAROSCOPIC INCISIONAL / UMBILICAL / VENTRAL HERNIA REPAIR  12/2007   recurrent umbilical ventral hernia w/mesh  . LAPAROSCOPY  10/02/2011   Procedure: LAPAROSCOPY DIAGNOSTIC;  Surgeon: Sherron Monday, MD;  Location: [facility];  Service: Gynecology;;  . SVD     x 4  . TUBAL LIGATION  09/2006  . UMBILICAL HERNIA REPAIR  06/2003   umbilical hernia repair  . VAGINAL HYSTERECTOMY  10/02/2011   Procedure: HYSTERECTOMY VAGINAL;  Surgeon: Sherron Monday, MD;  Location: [facility];  Service: Gynecology;  Laterality: N/A;   Family History  Problem Relation Age of Onset  . Diabetes Sister   . Hypertension Mother   . GER disease Mother   . Diabetes Maternal Grandmother    Social History   Tobacco Use  . Smoking status: Never Smoker  . Smokeless tobacco: Never Used  Vaping Use  . Vaping Use: Never used  Substance Use Topics  . Alcohol use: Yes    Alcohol/week: 0.0 standard drinks    Comment: socially  . Drug use: No   Current Outpatient Medications  Medication Sig Dispense Refill  .  cetirizine (ZYRTEC) 10 MG tablet Take 10 mg by mouth as needed for allergies.    Marland Kitchen icosapent Ethyl (VASCEPA) 1 g capsule as needed.    Marland Kitchen lisinopril (PRINIVIL,ZESTRIL) 20 MG tablet Take 1 tablet by mouth daily.    Marland Kitchen omeprazole (PRILOSEC) 20 MG capsule Take 1 capsule by mouth 2 (two) times daily.     No current facility-administered medications for this visit.   Allergies  Allergen Reactions  . Atorvastatin     Other reaction(s): Other  . Statins     Other reaction(s): Other (See Comments) Chest pains     Review of Systems: All  systems reviewed and negative except where noted in HPI.    PHYSICAL EXAM :    Wt Readings from Last 3 Encounters:  10/12/20 185 lb 6 oz (84.1 kg)   08/25/14 168 lb 2 oz (76.3 kg)  10/03/11 170 lb (77.1 kg)    BP 134/90 (BP Location: Left Arm, Patient Position: Sitting, Cuff Size: Normal)   Pulse 84   Ht 5' 2.25" (1.581 m) Comment: height measured without shoes  Wt 185 lb 6 oz (84.1 kg)   LMP 09/03/2011   BMI 33.63 kg/m  Constitutional:  Pleasant female in no acute distress. Psychiatric: Normal mood and affect. Behavior is normal. EENT: Pupils normal.  Conjunctivae are normal. No scleral icterus. Neck supple.  Cardiovascular: Normal rate, regular rhythm. No edema Pulmonary/chest: Effort normal and breath sounds normal. No wheezing, rales or rhonchi. Abdominal: Soft, nondistended, nontender. Bowel sounds active throughout. There are no masses palpable. No hepatomegaly. Neurological: Alert and oriented to person place and time. Skin: Skin is warm and dry. No rashes noted.  Willette Cluster, NP  10/12/2020, 3:43 PM

## 2020-10-12 NOTE — Patient Instructions (Signed)
If you are age 44 or older, your body mass index should be between 23-30. Your Body mass index is 33.63 kg/m. If this is out of the aforementioned range listed, please consider follow up with your Primary Care Provider.  If you are age 26 or younger, your body mass index should be between 19-25. Your Body mass index is 33.63 kg/m. If this is out of the aformentioned range listed, please consider follow up with your Primary Care Provider.   Your provider has requested that you go to the basement level for lab work before leaving today. Press "B" on the elevator. The lab is located at the first door on the left as you exit the elevator.  We have sent the following medications to your pharmacy for you to pick up at your convenience: Flagyl 250 mg three times daily for 7 days. Dicyclomine 10 mg twice daily as needed.  Follow up in 4 weeks.

## 2020-10-12 NOTE — Progress Notes (Signed)
Attending Physician's Attestation   I have reviewed the chart.   I agree with the Advanced Practitioner's note, impression, and recommendations with any updates as below. Okay for conservative trial at this time.  If symptoms persist endoscopic evaluation from above and below should be considered.  EPI and SIBO evaluation very reasonable and although rifaximin would be most ideal for potential SIBO treatment, IBS-D treatment with Xifaxan could be considered as well if Flagyl does not make a difference.    Corliss Parish, MD [facility] Office # [phone]

## 2020-10-13 LAB — IGA: Immunoglobulin A: 136 mg/dL (ref 47–310)

## 2020-10-13 LAB — TISSUE TRANSGLUTAMINASE ABS,IGG,IGA
(tTG) Ab, IgA: <1 U/mL
(tTG) Ab, IgG: <1 U/mL

## 2020-11-11 ENCOUNTER — Ambulatory Visit: Payer: Self-pay | Admitting: Nurse Practitioner

## 2021-12-27 ENCOUNTER — Other Ambulatory Visit: Payer: Self-pay | Admitting: Physician Assistant

## 2021-12-27 DIAGNOSIS — Z1231 Encounter for screening mammogram for malignant neoplasm of breast: Secondary | ICD-10-CM

## 2022-01-11 ENCOUNTER — Ambulatory Visit
Admission: RE | Admit: 2022-01-11 | Discharge: 2022-01-11 | Disposition: A | Discharge status: Home / Self Care | Payer: BC Managed Care – PPO | Source: Ambulatory Visit | Attending: Physician Assistant | Admitting: Physician Assistant

## 2022-01-11 DIAGNOSIS — Z1231 Encounter for screening mammogram for malignant neoplasm of breast: Secondary | ICD-10-CM

## 2023-05-08 ENCOUNTER — Ambulatory Visit (INDEPENDENT_AMBULATORY_CARE_PROVIDER_SITE_OTHER): Payer: BC Managed Care – PPO | Admitting: Plastic Surgery

## 2023-05-08 ENCOUNTER — Encounter: Payer: Self-pay | Admitting: Plastic Surgery

## 2023-05-08 VITALS — BP 134/84 | HR 77 | Ht 63.0 in | Wt 179.6 lb

## 2023-05-08 DIAGNOSIS — R21 Rash and other nonspecific skin eruption: Secondary | ICD-10-CM

## 2023-05-08 DIAGNOSIS — E65 Localized adiposity: Secondary | ICD-10-CM

## 2023-05-08 NOTE — Progress Notes (Signed)
Referring Provider Lindaann Pascal, PA-C [address] [place],  [place] [address]   CC:  Chief Complaint  Patient presents with   Advice Only      Christie [name] is an 47 y.o. female.  HPI: Christie [name] is a 47 year old female who presents today with complaints of excess skin and fat on the anterior abdominal wall.  She states that she often gets rashes on the posterior aspect of the skin just above the pubis and requires over-the-counter medicated powders and deodorant.  She also notes a particularly objectionable odor from the intertriginous regions.  The patient feels that this worsened after her hernia repairs in 2005 and 2009.  She is requesting surgical treatment of this excess tissue.  She has lost 10 pounds over the past year.  She states that she is not a smoker and does not use any type of blood thinners.  Allergies  Allergen Reactions   Atorvastatin     Other reaction(s): Other   Statins     Other reaction(s): Other (See Comments) Chest pains    Outpatient Encounter Medications as of 05/08/2023  Medication Sig   lisinopril (PRINIVIL,ZESTRIL) 20 MG tablet Take 1 tablet by mouth daily.   omeprazole (PRILOSEC) 20 MG capsule Take 1 capsule by mouth 2 (two) times daily.   [DISCONTINUED] cetirizine (ZYRTEC) 10 MG tablet Take 10 mg by mouth as needed for allergies. (Patient not taking: Reported on 05/08/2023)   [DISCONTINUED] dicyclomine (BENTYL) 10 MG capsule Take 1 capsule (10 mg total) by mouth 2 (two) times daily as needed for spasms. (Patient not taking: Reported on 05/08/2023)   [DISCONTINUED] icosapent Ethyl (VASCEPA) 1 g capsule as needed. (Patient not taking: Reported on 05/08/2023)   No facility-administered encounter medications on file as of 05/08/2023.     Past Medical History:  Diagnosis Date   Abnormal Pap smear    history   Anxiety    Dysmenorrhea 10/02/2011   Dyspareunia, female 10/02/2011   Elevated cholesterol    Hyperlipidemia    Hypertension     Vitamin D deficiency     Past Surgical History:  Procedure Laterality Date   COLONOSCOPY  2003   DIAGNOSTIC LAPAROSCOPY  06/2003   tx of endometriosis   LAPAROSCOPIC INCISIONAL / UMBILICAL / VENTRAL HERNIA REPAIR  12/2007   recurrent umbilical ventral hernia w/mesh   LAPAROSCOPY  10/02/2011   Procedure: LAPAROSCOPY DIAGNOSTIC;  Surgeon: Sherron Monday, MD;  Location: [facility];  Service: Gynecology;;   SVD     x 4   TUBAL LIGATION  09/2006   UMBILICAL HERNIA REPAIR  06/2003   umbilical hernia repair   VAGINAL HYSTERECTOMY  10/02/2011   Procedure: HYSTERECTOMY VAGINAL;  Surgeon: Sherron Monday, MD;  Location: [facility];  Service: Gynecology;  Laterality: N/A;    Family History  Problem Relation Age of Onset   Diabetes Sister    Hypertension Mother    GER disease Mother    Diabetes Maternal Grandmother     Social History   Social History Narrative   Not on file     Review of Systems General: Denies fevers, chills, weight loss CV: Denies chest pain, shortness of breath, palpitations Abdomen: Excess skin and fat which hangs down over the symphysis pubis and results and rashes on the posterior aspect of the pannus and in the intertriginous regions  Physical Exam    05/08/2023    8:51 AM 10/12/2020    3:12 PM 08/25/2014    8:52 AM  Vitals with BMI  Height 5\' 3"  5' 2.25" 5\' 3"   Weight 179 lbs 10 oz 185 lbs 6 oz 168 lbs 2 oz  BMI 31.82 33.64 29.8  Systolic 134 134 914  Diastolic 84 90 100  Pulse 77 84 72    General:  No acute distress,  Alert and oriented, Non-Toxic, Normal speech and affect Integument: Patient has a moderate pannus which extends just past the anterior superior iliac spines laterally and hangs to the level of the symphysis pubis in the central portion.  She does have 2 well-healed surgical scars over the umbilicus. Abdomen: Patient has a protuberant abdomen.  There is a very minimal rectus diastases and I am not able to palpate any hernia on the anterior abdominal wall.   Specifically I do not feel any recurrent umbilical hernia though there is a significant amount of scar tissue from her previous surgeries.  The abdomen is soft and nontender to palpation Mammogram: August 2023 BI-RADS 1 Assessment/Plan Pannus: Patient has a moderate pannus and would likely achieve resolution of her rashes and odor with a panniculectomy.  I showed her where the location of the incisions would be and we discussed the unpredictable nature of scarring.  We discussed what is removed and what is not removed with a panniculectomy.  Specifically nothing above the umbilicus is addressed.  We discussed the risks of panniculectomy including bleeding, infection, and seroma formation.  She understands that she will need to wear compression for 6 weeks and I will use drains which will be in place for 2 to 4 weeks.  The postoperative limitations include no heavy lifting greater than 20 pounds, no vigorous activity, no submerging the incisions in water for 6 weeks.  All questions were answered to the patient's satisfaction.  Photographs are obtained today with her consent.  Will submit her for a panniculectomy at her request.  Santiago Glad 05/08/2023, 9:24 AM

## 2023-06-07 ENCOUNTER — Other Ambulatory Visit: Payer: Self-pay | Admitting: Physician Assistant

## 2023-06-07 DIAGNOSIS — Z1231 Encounter for screening mammogram for malignant neoplasm of breast: Secondary | ICD-10-CM

## 2023-06-20 ENCOUNTER — Ambulatory Visit
Admission: RE | Admit: 2023-06-20 | Discharge: 2023-06-20 | Disposition: A | Discharge status: Home / Self Care | Payer: BC Managed Care – PPO | Source: Ambulatory Visit | Attending: Physician Assistant | Admitting: Physician Assistant

## 2023-06-20 DIAGNOSIS — Z1231 Encounter for screening mammogram for malignant neoplasm of breast: Secondary | ICD-10-CM

## 2024-05-27 ENCOUNTER — Other Ambulatory Visit (HOSPITAL_BASED_OUTPATIENT_CLINIC_OR_DEPARTMENT_OTHER): Payer: Self-pay | Admitting: *Deleted

## 2024-05-27 DIAGNOSIS — E7800 Pure hypercholesterolemia, unspecified: Secondary | ICD-10-CM

## 2024-06-11 ENCOUNTER — Telehealth: Payer: Self-pay | Admitting: Plastic Surgery

## 2024-06-11 NOTE — Telephone Encounter (Signed)
 Pt called seeing if a new referral had come for her, she said she will call back to sch after calling her Dr asking about the referral they were to send. Pt stated she was seen back in 2024 and was denied due to no notes or photos sent. Pt was asking why, but there is no route for sx, not sure when our new system stated with the referral tracking.

## 2024-06-15 ENCOUNTER — Ambulatory Visit (HOSPITAL_BASED_OUTPATIENT_CLINIC_OR_DEPARTMENT_OTHER)
Admission: RE | Admit: 2024-06-15 | Discharge: 2024-06-15 | Disposition: A | Discharge status: Home / Self Care | Payer: Self-pay | Source: Ambulatory Visit | Attending: *Deleted | Admitting: *Deleted

## 2024-06-15 DIAGNOSIS — E7800 Pure hypercholesterolemia, unspecified: Secondary | ICD-10-CM | POA: Insufficient documentation

## 2024-06-17 NOTE — Telephone Encounter (Signed)
 Pt came into office asking if we rec referral from her consult back in 2024, we did rec her full referral on 06-15-24. I had already sent a message to the sx schedulers to updated them, spoke with Melissa and rec the pt sch a f/u, pt was last seen in 2024 and needs new photos and weight check. Pt has also had some pelvis therapy and will bring those notes on her f/u and any new documents we can scan into her chart. Pt wants to try and move fwd with a panniculectomy

## 2024-07-02 ENCOUNTER — Encounter: Payer: Self-pay | Admitting: Plastic Surgery

## 2024-07-02 ENCOUNTER — Ambulatory Visit: Admitting: Plastic Surgery

## 2024-07-02 VITALS — BP 143/88 | Ht 63.0 in | Wt 173.0 lb

## 2024-07-02 DIAGNOSIS — E65 Localized adiposity: Secondary | ICD-10-CM | POA: Diagnosis not present

## 2024-07-02 DIAGNOSIS — M793 Panniculitis, unspecified: Secondary | ICD-10-CM

## 2024-07-02 NOTE — Progress Notes (Signed)
 Christie [name] returns to see me after her initial consultation approximately 1 year ago.  At that time she was felt to be a candidate for a panniculectomy.  She was lost to follow-up for a year but returns today to discuss moving forward.  She has not gained or lost any appreciable amount of weight.  She denies having any new medical issues though she has had some low back pain and pelvic floor weakness possibly leading to fecal incontinence..  She does however note some weakness above the umbilicus.  On physical examination I can feel what she is concerned about and I am not sure whether there is a small hernia.  As we discussed it would be better to find out if there is an abdominal wall defect prior to surgery rather than during the procedure and requiring an intraoperative consultation from general surgery.  I will put in a request for a CT scan to evaluate the abdomen specifically the supraumbilical region.  After the CT scan is complete we will either submit her for a panniculectomy or obtain a consultation from general surgery for a Co. case to repair the hernia at the same time that the panniculectomy is done.  I did discuss with her that any pelvic floor issues are very unlikely to be improved with a panniculectomy.  She understands and is still eager to proceed.  Follow-up with me after CT scan  I spent 30 minutes discussing panniculectomy's with the patient, examining the patient, coordinating care, and documenting.

## 2024-07-10 ENCOUNTER — Ambulatory Visit (HOSPITAL_COMMUNITY)
